# Patient Record
Sex: Female | Born: 1990 | Race: White | Hispanic: No | Marital: Married | Smoking: Never smoker
Health system: Southern US, Community
[De-identification: ages and names within clinical notes are randomized; demographics above are authoritative.]

## PROBLEM LIST (undated history)

## (undated) ENCOUNTER — Inpatient Hospital Stay (HOSPITAL_COMMUNITY): Payer: Self-pay

## (undated) ENCOUNTER — Ambulatory Visit

## (undated) DIAGNOSIS — R51 Headache: Secondary | ICD-10-CM

## (undated) DIAGNOSIS — B950 Streptococcus, group A, as the cause of diseases classified elsewhere: Secondary | ICD-10-CM

## (undated) DIAGNOSIS — B951 Streptococcus, group B, as the cause of diseases classified elsewhere: Secondary | ICD-10-CM

## (undated) DIAGNOSIS — F329 Major depressive disorder, single episode, unspecified: Secondary | ICD-10-CM

## (undated) DIAGNOSIS — O0930 Supervision of pregnancy with insufficient antenatal care, unspecified trimester: Secondary | ICD-10-CM

## (undated) DIAGNOSIS — F410 Panic disorder [episodic paroxysmal anxiety] without agoraphobia: Secondary | ICD-10-CM

## (undated) DIAGNOSIS — E271 Primary adrenocortical insufficiency: Secondary | ICD-10-CM

## (undated) DIAGNOSIS — O98819 Other maternal infectious and parasitic diseases complicating pregnancy, unspecified trimester: Secondary | ICD-10-CM

## (undated) DIAGNOSIS — G932 Benign intracranial hypertension: Secondary | ICD-10-CM

## (undated) DIAGNOSIS — M726 Necrotizing fasciitis: Secondary | ICD-10-CM

## (undated) HISTORY — DX: Headache: R51

## (undated) HISTORY — DX: Major depressive disorder, single episode, unspecified: F32.9

## (undated) HISTORY — DX: Streptococcus, group A, as the cause of diseases classified elsewhere: B95.0

## (undated) HISTORY — DX: Supervision of pregnancy with insufficient antenatal care, unspecified trimester: O09.30

## (undated) HISTORY — PX: TONSILLECTOMY: SUR1361

## (undated) HISTORY — DX: Other maternal infectious and parasitic diseases complicating pregnancy, unspecified trimester: O98.819

## (undated) HISTORY — PX: OTHER SURGICAL HISTORY: SHX169

## (undated) HISTORY — DX: Benign intracranial hypertension: G93.2

## (undated) HISTORY — PX: LUMBAR PUNCTURE: SHX1985

## (undated) HISTORY — DX: Necrotizing fasciitis: M72.6

## (undated) HISTORY — PX: PYLOROMYOTOMY: SUR1063

## (undated) HISTORY — DX: Streptococcus, group b, as the cause of diseases classified elsewhere: B95.1

---
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
## 2010-06-25 ENCOUNTER — Emergency Department: Payer: Self-pay | Admitting: Emergency Medicine

## 2012-07-18 ENCOUNTER — Inpatient Hospital Stay: Payer: Self-pay | Admitting: Obstetrics and Gynecology

## 2012-07-19 LAB — CBC WITH DIFFERENTIAL/PLATELET
Basophil %: 0.5 %
Eosinophil #: 0.1 10*3/uL (ref 0.0–0.7)
Eosinophil %: 0.7 %
HGB: 13 g/dL (ref 12.0–16.0)
Lymphocyte #: 2.5 10*3/uL (ref 1.0–3.6)
MCHC: 34.2 g/dL (ref 32.0–36.0)
Monocyte #: 1.2 x10 3/mm — ABNORMAL HIGH (ref 0.2–0.9)
Neutrophil #: 8 10*3/uL — ABNORMAL HIGH (ref 1.4–6.5)
Neutrophil %: 67.4 %
Platelet: 209 10*3/uL (ref 150–440)
RDW: 13.3 % (ref 11.5–14.5)

## 2012-07-21 LAB — HEMATOCRIT: HCT: 30.4 % — ABNORMAL LOW (ref 35.0–47.0)

## 2012-09-26 NOTE — L&D Delivery Note (Signed)
Delivery Note At 5:28 AM a viable female was delivered via Vaginal, Spontaneous Delivery (Presentation: Right Occiput Anterior).  APGAR: 9, 9; weight TBD.   Placenta status: Intact, Spontaneous.  Cord: 3 vessels with the following complications: None.    Anesthesia: Epidural  Episiotomy: None Lacerations: None Suture Repair: na Est. Blood Loss (mL): 200  Mom to postpartum.  Baby to nursery-stable.  Pt pushed with good maternal effort to deliver a liveborn female via SVD. Moderate mec but with spontaneous cry.  Cord cut and clamped. Placenta delivered spontaneously intact with 3 vessel cord. No tears or complications. Moma nd baby doing well.   Mattingly Fountaine L 06/22/2013, 6:03 AM

## 2012-11-08 ENCOUNTER — Ambulatory Visit: Payer: Self-pay

## 2012-12-15 ENCOUNTER — Emergency Department: Payer: Self-pay | Admitting: Emergency Medicine

## 2012-12-15 LAB — CBC
HGB: 14.1 g/dL (ref 12.0–16.0)
MCHC: 34.3 g/dL (ref 32.0–36.0)
MCV: 84 fL (ref 80–100)
Platelet: 180 10*3/uL (ref 150–440)
RBC: 4.87 10*6/uL (ref 3.80–5.20)
RDW: 14.9 % — ABNORMAL HIGH (ref 11.5–14.5)
WBC: 9.7 10*3/uL (ref 3.6–11.0)

## 2012-12-15 LAB — URINALYSIS, COMPLETE
Bacteria: NONE SEEN
Bilirubin,UR: NEGATIVE
Blood: NEGATIVE
Leukocyte Esterase: NEGATIVE
Ph: 5 (ref 4.5–8.0)
Protein: NEGATIVE
RBC,UR: 1 /HPF (ref 0–5)
Specific Gravity: 1.025 (ref 1.003–1.030)

## 2012-12-15 LAB — HCG, QUANTITATIVE, PREGNANCY: Beta Hcg, Quant.: 46608 m[IU]/mL — ABNORMAL HIGH

## 2012-12-15 LAB — COMPREHENSIVE METABOLIC PANEL
Albumin: 3.4 g/dL (ref 3.4–5.0)
Alkaline Phosphatase: 58 U/L (ref 50–136)
BUN: 11 mg/dL (ref 7–18)
Bilirubin,Total: 0.4 mg/dL (ref 0.2–1.0)
Calcium, Total: 8.9 mg/dL (ref 8.5–10.1)
Chloride: 102 mmol/L (ref 98–107)
Co2: 24 mmol/L (ref 21–32)
Creatinine: 0.49 mg/dL — ABNORMAL LOW (ref 0.60–1.30)
EGFR (African American): >60
Osmolality: 271 (ref 275–301)
SGOT(AST): 25 U/L (ref 15–37)
SGPT (ALT): 25 U/L (ref 12–78)
Sodium: 136 mmol/L (ref 136–145)

## 2012-12-15 LAB — PRO B NATRIURETIC PEPTIDE: B-Type Natriuretic Peptide: 30 pg/mL (ref 0–125)

## 2013-02-13 ENCOUNTER — Ambulatory Visit (INDEPENDENT_AMBULATORY_CARE_PROVIDER_SITE_OTHER): Payer: Self-pay

## 2013-02-13 DIAGNOSIS — Z3491 Encounter for supervision of normal pregnancy, unspecified, first trimester: Secondary | ICD-10-CM

## 2013-02-14 LAB — OBSTETRIC PANEL
Eosinophils Absolute: 0.1 10*3/uL (ref 0.0–0.7)
HCT: 34.9 % — ABNORMAL LOW (ref 36.0–46.0)
Hemoglobin: 12 g/dL (ref 12.0–15.0)
Hepatitis B Surface Ag: NEGATIVE
Lymphs Abs: 2.6 10*3/uL (ref 0.7–4.0)
MCH: 29.4 pg (ref 26.0–34.0)
Monocytes Relative: 10 % (ref 3–12)
Neutrophils Relative %: 62 % (ref 43–77)
RBC: 4.08 MIL/uL (ref 3.87–5.11)
Rh Type: POSITIVE

## 2013-02-14 LAB — POCT PREGNANCY, URINE: Preg Test, Ur: POSITIVE — AB

## 2013-03-06 ENCOUNTER — Ambulatory Visit (INDEPENDENT_AMBULATORY_CARE_PROVIDER_SITE_OTHER): Payer: Managed Care, Other (non HMO) | Admitting: Advanced Practice Midwife

## 2013-03-06 ENCOUNTER — Encounter: Payer: Self-pay | Admitting: Advanced Practice Midwife

## 2013-03-06 VITALS — BP 120/80 | Temp 98.5°F | Ht 59.0 in | Wt 237.6 lb

## 2013-03-06 DIAGNOSIS — O0933 Supervision of pregnancy with insufficient antenatal care, third trimester: Secondary | ICD-10-CM

## 2013-03-06 DIAGNOSIS — O26893 Other specified pregnancy related conditions, third trimester: Secondary | ICD-10-CM

## 2013-03-06 DIAGNOSIS — N898 Other specified noninflammatory disorders of vagina: Secondary | ICD-10-CM

## 2013-03-06 DIAGNOSIS — Z3482 Encounter for supervision of other normal pregnancy, second trimester: Secondary | ICD-10-CM

## 2013-03-06 DIAGNOSIS — Z3493 Encounter for supervision of normal pregnancy, unspecified, third trimester: Secondary | ICD-10-CM | POA: Insufficient documentation

## 2013-03-06 DIAGNOSIS — O0930 Supervision of pregnancy with insufficient antenatal care, unspecified trimester: Secondary | ICD-10-CM

## 2013-03-06 DIAGNOSIS — O9989 Other specified diseases and conditions complicating pregnancy, childbirth and the puerperium: Secondary | ICD-10-CM

## 2013-03-06 HISTORY — DX: Supervision of pregnancy with insufficient antenatal care, unspecified trimester: O09.30

## 2013-03-06 LAB — POCT URINALYSIS DIP (DEVICE)
Glucose, UA: NEGATIVE mg/dL
Ketones, ur: 15 mg/dL — AB
Specific Gravity, Urine: >=1.03 (ref 1.005–1.030)

## 2013-03-06 NOTE — Progress Notes (Signed)
Pulse- 92 Patient reports frequent cramping but relates this pain to constipation; also reports creamy d/c with odor

## 2013-03-06 NOTE — Progress Notes (Signed)
   Subjective:    Carol [name] is a G2P1001 [redacted]w[redacted]d being seen today for her first obstetrical visit.  Her obstetrical history is significant for elevated blood pressure at 39 weeks of her previous pregnancy leading to induction. Pt reports baby was stuck in birth control leading to difficult birth and complications for baby, including blood clots, seizures, and stroke. . Patient does intend to breast feed. Pregnancy history fully reviewed.    Patient reports intermittent abdominal cramping, backache, and nausea. She describes these sx as comparable to her previous pregnancy. Complains of creamy, odorous vaginal discharge.  Filed Vitals:   03/06/13 1312 03/06/13 1315  BP: 120/80   Temp: 98.5 F (36.9 C)   Height:  4\' 11"  (1.499 m)  Weight: 107.775 kg (237 lb 9.6 oz)     HISTORY: OB History   Grav Para Term Preterm Abortions TAB SAB Ect Mult Living   2 1 1  0 0 0 0 0 0 1     # Outc Date GA Lbr Len/2nd Wgt Sex Del Anes PTL Lv   1 TRM 10/13 [redacted]w[redacted]d  1.610RU([id number]) M SVD EPI  Yes   Comments: patient had high BP x1 prenatal visit & was induced; infant was stuck in birth canal 1.5 hrs    2 CUR              Past Medical History  Diagnosis Date  . Pseudotumor cerebri   . 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 Lumbar puncture    . Pyloromyotomy    . Tonsillectomy     Family History  Problem Relation Age of Onset  . Asthma Father   . Asthma Sister   . Mental illness Sister   . Diabetes Maternal Grandmother   . Cancer Paternal Grandmother   . Diabetes Paternal Grandfather      Exam    Uterus:   Fundal height 29 cm.  Pelvic Exam:    Perineum: No Hemorrhoids, Normal Perineum   Vulva: normal   Vagina:  normal mucosa, normal discharge, wet prep done       Cervix: anteverted, no bleeding following Pap, no cervical motion tenderness and no lesions   Adnexa: normal adnexa  and no mass, fullness, tenderness      System:     Skin: normal coloration and turgor, no rashes                                Abdomen: soft, non-tender; bowel sounds normal; no masses,  no organomegaly   Fetal heart rate 150 bpm   Assessment:    Pregnancy: G2P1001 Late prenatal care, third trimester History of difficult childbirth   There are no active problems to display for this patient.       Plan:     Initial labs drawn. Continue prenatal vitamins. Obtain medical records from previous childbirth. Problem list reviewed and updated. Genetic Screening discussed; too late.   Ultrasound discussed; fetal survey: ordered.  Follow up in 3 weeks.   Ardis Hughs 03/06/2013

## 2013-03-06 NOTE — Progress Notes (Signed)
I have seen the patient with the resident/student and agree with the above.  Hogan, Heather Donovan  

## 2013-03-06 NOTE — Patient Instructions (Signed)
Prenatal Care  WHAT IS PRENATAL CARE?  Prenatal care means health care during your pregnancy, before your baby is born. Take care of yourself and your baby by:   Getting early prenatal care. If you know you are pregnant, or think you might be pregnant, call your caregiver as soon as possible. Schedule a visit for a general/prenatal examination.  Getting regular prenatal care. Follow your caregiver's schedule for blood and other necessary tests. Do not miss appointments.  Do everything you can to keep yourself and your baby healthy during your pregnancy.  Prenatal care should include evaluation of medical, dietary, educational, psychological, and social needs for the couple and the medical, surgical, and genetic history of the family of the mother and father.  Discuss with your caregiver:  Your medicines, prescription, over-the-counter, and herbal medicines.  Substance abuse, alcohol, smoking, and illegal drugs.  Domestic abuse and violence, if present.  Your immunizations.  Nutrition and diet.  Exercising.  Environment and occupational hazards, at home and at work.  History of sexually transmitted disease, both you and your partner.  Previous pregnancies. WHY IS PRENATAL CARE SO IMPORTANT?  By seeing you regularly, your caregiver has the chance to find problems early, so that they can be treated as soon as possible. Other problems might be prevented. Many studies have shown that early and regular prenatal care is important for the health of both mothers and their babies.  I AM THINKING ABOUT GETTING PREGNANT. HOW CAN I TAKE CARE OF MYSELF?  Taking care of yourself before you get pregnant helps you to have a healthy pregnancy. It also lowers your chances of having a baby born with a birth defect. Here are ways to take care of yourself before you get pregnant:   Eat healthy foods, exercise regularly (30 minutes per day for most days of the week is best), and get enough rest and  sleep. Talk to your caregiver about what kinds of foods and exercises are best for you.  Take 400 micrograms (mcg) of folic acid (one of the B vitamins) every day. The best way to do this is to take a daily multivitamin pill that contains this amount of folic acid. Getting enough of the synthetic (manufactured) form of folic acid every day before you get pregnant and during early pregnancy can help prevent certain birth defects. Many breakfast cereals and other grain products have folic acid added to them, but only certain cereals contain 400 mcg of folic acid per serving. Check the label on your multivitamin or cereal to find the amount of folic acid in the food.  See your caregiver for a complete check up before getting pregnant. Make sure that you have had all your immunization shots, especially for rubella (German measles). Rubella can cause serious birth defects. Chickenpox is another illness you want to avoid during pregnancy. If you have had chickenpox and rubella in the past, you should be immune to them.  Tell your caregiver about any prescription or non-prescription medicines (including herbal remedies) you are taking. Some medicines are not safe to take during pregnancy.  Stop smoking cigarettes, drinking alcohol, or taking illegal drugs. Ask your caregiver for help, if you need it. You can also get help with alcohol and drugs by talking with a member of your faith community, a counselor, or a trusted friend.  Discuss and treat any medical, social, or psychological problems before getting pregnant.  Discuss any history of genetic problems in the mother, father, and their families. Do   genetic testing before getting pregnant, when possible.  Discuss any physical or emotional abuse with your caregiver.  Discuss with your caregiver if you might be exposed to harmful chemicals on your job or where you live.  Discuss with your caregiver if you think your job or the hours you work may be  harmful and should be changed.  The father should be involved with the decision making and with all aspects of the pregnancy, labor, and delivery.  If you have medical insurance, make sure you are covered for pregnancy. I JUST FOUND OUT THAT I AM PREGNANT. HOW CAN I TAKE CARE OF MYSELF?  Here are ways to take care of yourself and the precious new life growing inside you:   Continue taking your multivitamin with 400 micrograms (mcg) of folic acid every day.  Get early and regular prenatal care. It does not matter if this is your first pregnancy or if you already have children. It is very important to see a caregiver during your pregnancy. Your caregiver will check at each visit to make sure that you and the baby are healthy. If there are any problems, action can be taken right away to help you and the baby.  Eat a healthy diet that includes:  Fruits.  Vegetables.  Foods low in saturated fat.  Grains.  Calcium-rich foods.  Drink 6 to 8 glasses of liquids a day.  Unless your caregiver tells you not to, try to be physically active for 30 minutes, most days of the week. If you are pressed for time, you can get your activity in through 10 minute segments, three times a day.  If you smoke, drink alcohol, or use drugs, STOP. These can cause long-term damage to your baby. Talk with your caregiver about steps to take to stop smoking. Talk with a member of your faith community, a counselor, a trusted friend, or your caregiver if you are concerned about your alcohol or drug use.  Ask your caregiver before taking any medicine, even over-the-counter medicines. Some medicines are not safe to take during pregnancy.  Get plenty of rest and sleep.  Avoid hot tubs and saunas during pregnancy.  Do not have X-rays taken, unless absolutely necessary and with the recommendation of your caregiver. A lead shield can be placed on your abdomen, to protect the baby when X-rays are taken in other parts of the  body.  Do not empty the cat litter when you are pregnant. It may contain a parasite that causes an infection called toxoplasmosis, which can cause birth defects. Also, use gloves when working in garden areas used by cats.  Do not eat uncooked or undercooked cheese, meats, or fish.  Stay away from toxic chemicals like:  Insecticides.  Solvents (some cleaners or paint thinners).  Lead.  Mercury.  Sexual relations may continue until the end of the pregnancy, unless you have a medical problem or there is a problem with the pregnancy and your caregiver tells you not to.  Do not wear high heel shoes, especially during the second half of the pregnancy. You can lose your balance and fall.  Do not take long trips, unless absolutely necessary. Be sure to see your caregiver before going on the trip.  Do not sit in one position for more than 2 hours, when on a trip.  Take a copy of your medical records when going on a trip.  Know where there is a hospital in the city you are visiting, in case of an   emergency.  Most dangerous household products will have pregnancy warnings on their labels. Ask your caregiver about products if you are unsure.  Limit or eliminate your caffeine intake from coffee, tea, sodas, medicines, and chocolate.  Many women continue working through pregnancy. Staying active might help you stay healthier. If you have a question about the safety or the hours you work at your particular job, talk with your caregiver.  Get informed:  Read books.  Watch videos.  Go to childbirth classes for you and the father.  Talk with experienced moms.  Ask your caregiver about childbirth education classes for you and your partner. Classes can help you and your partner prepare for the birth of your baby.  Ask about a pediatrician (baby doctor) and methods and pain medicine for labor, delivery, and possible Cesarean delivery (C-section). I AM NOT THINKING ABOUT GETTING PREGNANT  RIGHT NOW, BUT HEARD THAT ALL WOMEN SHOULD TAKE FOLIC ACID EVERY DAY?  All women of childbearing age, with even a remote chance of getting pregnant, should try to make sure they get enough folic acid. Many pregnancies are not planned. Many women do not know they are actually pregnant early in their pregnancies, and certain birth defects happen in the very early part of pregnancy. Taking 400 micrograms (mcg) of folic acid every day will help prevent certain birth defects that happen in the early part of pregnancy. If a woman begins taking vitamin pills in the second or third month of pregnancy, it may be too late to prevent birth defects. Folic acid may also have other health benefits for women, besides preventing birth defects.  HOW OFTEN SHOULD I SEE MY CAREGIVER DURING PREGNANCY?  Your caregiver will give you a schedule for your prenatal visits. You will have visits more often as you get closer to the end of your pregnancy. An average pregnancy lasts about 40 weeks.  A typical schedule includes visiting your caregiver:   About once each month, during your first 6 months of pregnancy.  Every 2 weeks, during the next 2 months.  Weekly in the last month, until the delivery date. Your caregiver will probably want to see you more often if:  You are over 35.  Your pregnancy is high risk, because you have certain health problems or problems with the pregnancy, such as:  Diabetes.  High blood pressure.  The baby is not growing on schedule, according to the dates of the pregnancy. Your caregiver will do special tests, to make sure you and the baby are not having any serious problems. WHAT HAPPENS DURING PRENATAL VISITS?   At your first prenatal visit, your caregiver will talk to you about you and your partner's health history and your family's health history, and will do a physical exam.  On your first visit, a physical exam will include checks of your blood pressure, height and weight, and an  exam of your pelvic organs. Your caregiver will do a Pap test if you have not had one recently, and will do cultures of your cervix to make sure there is no infection.  At each visit, there will be tests of your blood, urine, blood pressure, weight, and checking the progress of the baby.  Your caregiver will be able to tell you when to expect that your baby will be born.  Each visit is also a chance for you to learn about staying healthy during pregnancy and for asking questions.  Discuss whether you will be breastfeeding.  At your later prenatal   visits, your caregiver will check how you are doing and how the baby is developing. You may have a number of tests done as your pregnancy progresses.  Ultrasound exams are often used to check on the baby's growth and health.  You may have more urine and blood tests, as well as special tests, if needed. These may include amniocentesis (examine fluid in the pregnancy sac), stress tests (check how baby responds to contractions), biophysical profile (measures fetus well-being). Your caregiver will explain the tests and why they are necessary. I AM IN MY LATE THIRTIES, AND I WANT TO HAVE A CHILD NOW. SHOULD I DO ANYTHING SPECIAL?  As you get older, there is more chance of having a medical problem (high blood pressure), pregnancy problem (preeclampsia, problems with the placenta), miscarriage, or a baby born with a birth defect. However, most women in their late thirties and early forties have healthy babies. See your caregiver on a regular basis before you get pregnant and be sure to go for exams throughout your pregnancy. Your caregiver probably will want to do some special tests to check on you and your baby's health when you are pregnant.  Women today are often delaying having children until later in life, when they are in their thirties and forties. While many women in their thirties and forties have no difficulty getting pregnant, fertility does decline  with age. For women over 40 who cannot get pregnant after 6 months of trying, it is recommended that they see their caregiver for a fertility evaluation. It is not uncommon to have trouble becoming pregnant or experience infertility (inability to become pregnant after trying for one year). If you think that you or your partner may be infertile, you can discuss this with your caregiver. He or she can recommend treatments such as drugs, surgery, or assisted reproductive technology.  Document Released: 09/15/2003 Document Revised: 12/05/2011 Document Reviewed: 08/12/2009 [employer] Patient Information 2014 [employer].  

## 2013-03-07 LAB — WET PREP, GENITAL: Clue Cells Wet Prep HPF POC: NONE SEEN

## 2013-03-10 LAB — CULTURE, OB URINE

## 2013-03-11 ENCOUNTER — Telehealth: Payer: Self-pay | Admitting: General Practice

## 2013-03-11 NOTE — Telephone Encounter (Signed)
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

## 2013-03-11 NOTE — Telephone Encounter (Signed)
Message copied by Kathee Delton on Mon Mar 11, 2013 11:52 AM ------      Message from: Thressa Sheller D      Created: Sat Mar 09, 2013 12:57 AM      Regarding: Needs treatment for UTI       Please call and RX Keflex 500mg  TID #21 for UTI            Thank you ------

## 2013-03-12 ENCOUNTER — Ambulatory Visit (HOSPITAL_COMMUNITY)
Admission: RE | Admit: 2013-03-12 | Discharge: 2013-03-12 | Disposition: A | Discharge status: Home / Self Care | Payer: Medicare HMO | Source: Ambulatory Visit | Attending: Advanced Practice Midwife | Admitting: Advanced Practice Midwife

## 2013-03-12 DIAGNOSIS — Z3493 Encounter for supervision of normal pregnancy, unspecified, third trimester: Secondary | ICD-10-CM

## 2013-03-12 DIAGNOSIS — Z3689 Encounter for other specified antenatal screening: Secondary | ICD-10-CM | POA: Insufficient documentation

## 2013-03-12 DIAGNOSIS — Z3482 Encounter for supervision of other normal pregnancy, second trimester: Secondary | ICD-10-CM

## 2013-03-12 DIAGNOSIS — O0933 Supervision of pregnancy with insufficient antenatal care, third trimester: Secondary | ICD-10-CM

## 2013-03-12 DIAGNOSIS — N898 Other specified noninflammatory disorders of vagina: Secondary | ICD-10-CM

## 2013-03-12 DIAGNOSIS — O09299 Supervision of pregnancy with other poor reproductive or obstetric history, unspecified trimester: Secondary | ICD-10-CM | POA: Insufficient documentation

## 2013-03-13 ENCOUNTER — Other Ambulatory Visit: Payer: Self-pay | Admitting: Obstetrics and Gynecology

## 2013-03-13 DIAGNOSIS — N390 Urinary tract infection, site not specified: Secondary | ICD-10-CM

## 2013-03-13 MED ORDER — NITROFURANTOIN MONOHYD MACRO 100 MG PO CAPS: 100.0000 mg | ORAL_CAPSULE | Freq: Two times a day (BID) | ORAL | Status: DC

## 2013-03-13 NOTE — Telephone Encounter (Signed)
Per Wynelle Bourgeois, CNM patient prescribed Macrobid 100mg  one tablet BID X7 days. Left message on patient's vm of new Rx sent to [facility] pharmacy on file. If she has any further questions to give [place] a call back at the clinic.

## 2013-03-27 ENCOUNTER — Ambulatory Visit (INDEPENDENT_AMBULATORY_CARE_PROVIDER_SITE_OTHER): Payer: Medicare HMO | Admitting: Advanced Practice Midwife

## 2013-03-27 VITALS — BP 120/75 | Wt 239.0 lb

## 2013-03-27 DIAGNOSIS — O0933 Supervision of pregnancy with insufficient antenatal care, third trimester: Secondary | ICD-10-CM

## 2013-03-27 DIAGNOSIS — B952 Enterococcus as the cause of diseases classified elsewhere: Secondary | ICD-10-CM

## 2013-03-27 DIAGNOSIS — N390 Urinary tract infection, site not specified: Secondary | ICD-10-CM

## 2013-03-27 DIAGNOSIS — O0930 Supervision of pregnancy with insufficient antenatal care, unspecified trimester: Secondary | ICD-10-CM

## 2013-03-27 DIAGNOSIS — O2390 Unspecified genitourinary tract infection in pregnancy, unspecified trimester: Secondary | ICD-10-CM

## 2013-03-27 LAB — POCT URINALYSIS DIP (DEVICE)
Glucose, UA: NEGATIVE mg/dL
Ketones, ur: NEGATIVE mg/dL
Protein, ur: NEGATIVE mg/dL
Specific Gravity, Urine: >=1.03 (ref 1.005–1.030)
Urobilinogen, UA: 0.2 mg/dL (ref 0.0–1.0)

## 2013-03-27 MED ORDER — NITROFURANTOIN MONOHYD MACRO 100 MG PO CAPS: 100.0000 mg | ORAL_CAPSULE | Freq: Two times a day (BID) | ORAL | Status: DC

## 2013-03-27 NOTE — Progress Notes (Signed)
Have not gotten records from previous pregnancy. Patient will try to pick them up. Never picked up RX for UTI. Will resend today.

## 2013-03-27 NOTE — Progress Notes (Signed)
P-90 

## 2013-04-10 ENCOUNTER — Ambulatory Visit (INDEPENDENT_AMBULATORY_CARE_PROVIDER_SITE_OTHER): Payer: Medicare HMO | Admitting: Advanced Practice Midwife

## 2013-04-10 VITALS — BP 128/86 | Temp 97.0°F | Wt 240.7 lb

## 2013-04-10 DIAGNOSIS — O0933 Supervision of pregnancy with insufficient antenatal care, third trimester: Secondary | ICD-10-CM

## 2013-04-10 DIAGNOSIS — O0930 Supervision of pregnancy with insufficient antenatal care, unspecified trimester: Secondary | ICD-10-CM

## 2013-04-10 LAB — POCT URINALYSIS DIP (DEVICE)
Bilirubin Urine: NEGATIVE
Glucose, UA: NEGATIVE mg/dL
Nitrite: NEGATIVE
Specific Gravity, Urine: >=1.03 (ref 1.005–1.030)
Urobilinogen, UA: 0.2 mg/dL (ref 0.0–1.0)

## 2013-04-10 MED ORDER — NITROFURANTOIN MONOHYD MACRO 100 MG PO CAPS: 100.0000 mg | ORAL_CAPSULE | Freq: Two times a day (BID) | ORAL | Status: DC

## 2013-04-10 NOTE — Progress Notes (Signed)
I have seen the patient with the resident/student and agree with the above.   Addendum to the note: patient's mother is with her today, and is very insistent that she have a planned c-section for this pregnancy. She states that she had to push for a hour and a half, and then the baby was noted to have had strokes while in NICU. They are certain that all of this is from the birth, and want to have a planned c-section. Discussed incidence of fetal and neonatal stroke with the patient and her mother. At the very end of the visit the mother also informs me that the patient has had false tumor cerebrii, and has been followed by neuro. Advised patient to schedule a neuro appointment at this time, and will FU with [facility].

## 2013-04-10 NOTE — Progress Notes (Signed)
No PIH symptoms.  Good fetal movements. Has been having palpitations for 30 seconds and shortness of breath while sitting up.  Never picked up UTI Rx, needs for it to be resent

## 2013-04-10 NOTE — Progress Notes (Signed)
Pulse- 79 Patient reports low back pain & a lot of stomach pain upon sitting

## 2013-04-18 ENCOUNTER — Encounter: Payer: Self-pay | Admitting: Obstetrics & Gynecology

## 2013-04-18 ENCOUNTER — Ambulatory Visit (INDEPENDENT_AMBULATORY_CARE_PROVIDER_SITE_OTHER): Payer: Medicare HMO | Admitting: Obstetrics & Gynecology

## 2013-04-18 VITALS — BP 114/80 | Temp 97.8°F | Wt 241.0 lb

## 2013-04-18 DIAGNOSIS — N898 Other specified noninflammatory disorders of vagina: Secondary | ICD-10-CM

## 2013-04-18 DIAGNOSIS — Z3493 Encounter for supervision of normal pregnancy, unspecified, third trimester: Secondary | ICD-10-CM

## 2013-04-18 DIAGNOSIS — O0930 Supervision of pregnancy with insufficient antenatal care, unspecified trimester: Secondary | ICD-10-CM

## 2013-04-18 NOTE — Progress Notes (Signed)
Pulse 92 Edema trace in ankles. C/o pressure in pelvic. Vaginal d/c stated as thin whitish, with slight odor.

## 2013-04-18 NOTE — Patient Instructions (Signed)

## 2013-04-18 NOTE — Progress Notes (Signed)
No PIH symptoms, good fetal movement.  Pharmacy did not have Rx, needs it to be resent.  Denies fevers, chills.  Continues with palpitations for 30 seconds. Brought in letter today for provider.  Has been having right calf "knotting" and tenderness in the mornings.  Not sure if she has SOB.    Mild vaginal discharge, no vaginal bleeding.

## 2013-04-18 NOTE — Progress Notes (Signed)
Discussed history at last birth, infant had seizures, is on anticonvulsants. Need delivery record, may need growth [place]

## 2013-04-19 LAB — WET PREP, GENITAL
Clue Cells Wet Prep HPF POC: NONE SEEN
Trich, Wet Prep: NONE SEEN
Yeast Wet Prep HPF POC: NONE SEEN

## 2013-04-24 ENCOUNTER — Encounter: Payer: Medicare HMO | Admitting: Obstetrics and Gynecology

## 2013-05-02 ENCOUNTER — Encounter: Payer: Medicare HMO | Admitting: Family Medicine

## 2013-05-09 ENCOUNTER — Encounter: Payer: Self-pay | Admitting: Obstetrics and Gynecology

## 2013-05-09 ENCOUNTER — Ambulatory Visit (INDEPENDENT_AMBULATORY_CARE_PROVIDER_SITE_OTHER): Payer: Managed Care, Other (non HMO) | Admitting: Obstetrics and Gynecology

## 2013-05-09 VITALS — BP 121/82 | Wt 243.7 lb

## 2013-05-09 DIAGNOSIS — O0930 Supervision of pregnancy with insufficient antenatal care, unspecified trimester: Secondary | ICD-10-CM

## 2013-05-09 DIAGNOSIS — O0933 Supervision of pregnancy with insufficient antenatal care, third trimester: Secondary | ICD-10-CM

## 2013-05-09 DIAGNOSIS — Z3493 Encounter for supervision of normal pregnancy, unspecified, third trimester: Secondary | ICD-10-CM

## 2013-05-09 NOTE — Progress Notes (Signed)
Patient doing well without complaints. FM/PTL precautions reviewed. Will order follow up growth ultrasound as patient currently dated by 25 week sono. Patient with ? History of shoulder dystocia- will obtain delivery records

## 2013-05-09 NOTE — Progress Notes (Signed)
U/S scheduled 05/10/13 at 915 am. ROI signed for records from [facility].

## 2013-05-09 NOTE — Progress Notes (Signed)
Pulse- 94 

## 2013-05-10 ENCOUNTER — Ambulatory Visit (HOSPITAL_COMMUNITY)
Admission: RE | Admit: 2013-05-10 | Discharge: 2013-05-10 | Disposition: A | Discharge status: Home / Self Care | Payer: Managed Care, Other (non HMO) | Source: Ambulatory Visit | Attending: Obstetrics and Gynecology | Admitting: Obstetrics and Gynecology

## 2013-05-10 DIAGNOSIS — Z3493 Encounter for supervision of normal pregnancy, unspecified, third trimester: Secondary | ICD-10-CM

## 2013-05-10 DIAGNOSIS — Z3689 Encounter for other specified antenatal screening: Secondary | ICD-10-CM | POA: Insufficient documentation

## 2013-05-10 DIAGNOSIS — O365990 Maternal care for other known or suspected poor fetal growth, unspecified trimester, not applicable or unspecified: Secondary | ICD-10-CM | POA: Insufficient documentation

## 2013-05-15 ENCOUNTER — Inpatient Hospital Stay (HOSPITAL_COMMUNITY): Payer: Managed Care, Other (non HMO)

## 2013-05-15 ENCOUNTER — Encounter (HOSPITAL_COMMUNITY): Payer: Self-pay

## 2013-05-15 ENCOUNTER — Inpatient Hospital Stay (HOSPITAL_COMMUNITY)
Admission: AD | Admit: 2013-05-15 | Discharge: 2013-05-15 | Disposition: A | Discharge status: Home / Self Care | Payer: Managed Care, Other (non HMO) | Source: Ambulatory Visit | Attending: Obstetrics and Gynecology | Admitting: Obstetrics and Gynecology

## 2013-05-15 DIAGNOSIS — R109 Unspecified abdominal pain: Secondary | ICD-10-CM | POA: Insufficient documentation

## 2013-05-15 DIAGNOSIS — Y92410 Unspecified street and highway as the place of occurrence of the external cause: Secondary | ICD-10-CM | POA: Insufficient documentation

## 2013-05-15 DIAGNOSIS — O99891 Other specified diseases and conditions complicating pregnancy: Secondary | ICD-10-CM | POA: Insufficient documentation

## 2013-05-15 HISTORY — DX: Panic disorder (episodic paroxysmal anxiety): F41.0
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
## 2013-05-15 NOTE — Treatment Plan (Signed)
Dr Ike Bene notified of patients status.  Will order U/s

## 2013-05-19 NOTE — MAU Provider Note (Signed)
Attestation of Attending Supervision of Advanced Practitioner: Evaluation and management procedures were performed by the PA/NP/CNM/OB Fellow under my supervision/collaboration. Chart reviewed and agree with management and plan.  Noelle Hoogland V 05/19/2013 10:46 AM

## 2013-05-20 ENCOUNTER — Encounter: Payer: Self-pay | Admitting: *Deleted

## 2013-05-23 ENCOUNTER — Ambulatory Visit (INDEPENDENT_AMBULATORY_CARE_PROVIDER_SITE_OTHER): Payer: Managed Care, Other (non HMO) | Admitting: Advanced Practice Midwife

## 2013-05-23 ENCOUNTER — Encounter: Payer: Self-pay | Admitting: Advanced Practice Midwife

## 2013-05-23 VITALS — BP 128/70 | Temp 97.7°F | Wt 246.0 lb

## 2013-05-23 DIAGNOSIS — Z3493 Encounter for supervision of normal pregnancy, unspecified, third trimester: Secondary | ICD-10-CM

## 2013-05-23 DIAGNOSIS — O0933 Supervision of pregnancy with insufficient antenatal care, third trimester: Secondary | ICD-10-CM

## 2013-05-23 DIAGNOSIS — O0930 Supervision of pregnancy with insufficient antenatal care, unspecified trimester: Secondary | ICD-10-CM

## 2013-05-23 LAB — POCT URINALYSIS DIP (DEVICE)
Bilirubin Urine: NEGATIVE
Glucose, UA: NEGATIVE mg/dL
Hgb urine dipstick: NEGATIVE
Nitrite: NEGATIVE
Protein, ur: NEGATIVE mg/dL
Specific Gravity, Urine: >=1.03 (ref 1.005–1.030)
Urobilinogen, UA: 0.2 mg/dL (ref 0.0–1.0)
pH: 6 (ref 5.0–8.0)

## 2013-05-23 NOTE — Progress Notes (Signed)
Pulse- 106 Patient reports a lot of pelvic pressure and cramps in her side as well as braxton hicks contractions; reports a moderate amount of white d/c with odor

## 2013-05-23 NOTE — Patient Instructions (Addendum)
Vaginal Delivery Your caregiver must first be sure you are in labor. Signs of labor include:  You may pass what is called "the mucus plug" before labor begins. This is a small amount of blood stained mucus.  Regular uterine contractions.  The time between contractions get closer together.  The discomfort and pain gradually gets more intense.  Pains are mostly located in the back.  Pains get worse when walking.  The cervix (the opening of the uterus) becomes thinner (begins to efface) and opens up (dilates). Once you are in labor and admitted into the hospital or care center, your caregiver will do the following:  A complete physical examination.  Check your vital signs (blood pressure, pulse, temperature and the fetal heart rate).  Do a vaginal examination (using a sterile glove and lubricant) to determine:  The position (presentation) of the baby (head [vertex] or buttock first).  The level (station) of the baby's head in the birth canal.  The effacement and dilatation of the cervix.  An electronic monitor is usually placed on your abdomen. The monitor follows the length and intensity of the contractions, as well as the baby's heart rate.  Usually, your caregiver will insert an IV in your arm with a bottle of sugar water. This is done as a precaution so that medications can be given to you quickly during labor or delivery. NORMAL LABOR AND DELIVERY IS DIVIDED UP INTO 3 STAGES: First Stage This is when regular contractions begin and the cervix begins to efface and dilate. This stage can last from 3 to 15 hours. The end of the first stage is when the cervix is 100% effaced and 10 centimeters dilated. Pain medications may be given by   Injection (morphine, demerol, etc.)  Regional anesthesia (spinal, caudal or epidural, anesthetics given in different locations of the spine). Paracervical pain medication may be given, which is an injection of and anesthetic on each side of the  cervix. A pregnant woman may request to have "Natural Childbirth" which is not to have any medications or anesthesia during her labor and delivery. Second Stage This is when the baby comes down through the birth canal (vagina) and is born. This can take 1 to 4 hours. As the baby's head comes down through the birth canal, you may feel like you are going to have a bowel movement. You will get the urge to bear down and push until the baby is delivered. As the baby's head is being delivered, the caregiver will decide if an episiotomy (a cut in the perineum and vagina area) is needed to prevent tearing of the tissue in this area. The episiotomy is sewn up after the delivery of the baby and placenta. Sometimes a mask with nitrous oxide is given for the mother to breath during the delivery of the baby to help if there is too much pain. The end of Stage 2 is when the baby is fully delivered. Then when the umbilical cord stops pulsating it is clamped and cut. Third Stage The third stage begins after the baby is completely delivered and ends after the placenta (afterbirth) is delivered. This usually takes 5 to 30 minutes. After the placenta is delivered, a medication is given either by intravenous or injection to help contract the uterus and prevent bleeding. The third stage is not painful and pain medication is usually not necessary. If an episiotomy was done, it is repaired at this time. After the delivery, the mother is watched and monitored closely for   1 to 2 hours to make sure there is no postpartum bleeding (hemorrhage). If there is a lot of bleeding, medication is given to contract the uterus and stop the bleeding. Document Released: 06/21/2008 Document Revised: 06/06/2012 Document Reviewed: 06/21/2008 Exit[employer] Patient Information 2014 [employer].  

## 2013-05-23 NOTE — Progress Notes (Signed)
Doing well. Concerned re: mode of delivery. States [facility] MD recommends C/S delivery due to unsure cause of infant's seizures after last birth. Will consult MFM.  Per Dr Macon Large, this is not an indication for elective C/S.

## 2013-05-24 ENCOUNTER — Encounter (HOSPITAL_COMMUNITY): Payer: Self-pay

## 2013-05-24 ENCOUNTER — Ambulatory Visit (HOSPITAL_COMMUNITY)
Admission: RE | Admit: 2013-05-24 | Discharge: 2013-05-24 | Disposition: A | Discharge status: Home / Self Care | Payer: Managed Care, Other (non HMO) | Source: Ambulatory Visit | Attending: Advanced Practice Midwife | Admitting: Advanced Practice Midwife

## 2013-05-24 VITALS — BP 118/74 | HR 98 | Wt 247.0 lb

## 2013-05-24 DIAGNOSIS — Z3493 Encounter for supervision of normal pregnancy, unspecified, third trimester: Secondary | ICD-10-CM

## 2013-05-24 DIAGNOSIS — O0933 Supervision of pregnancy with insufficient antenatal care, third trimester: Secondary | ICD-10-CM

## 2013-05-24 LAB — GC/CHLAMYDIA PROBE AMP
CT Probe RNA: NEGATIVE
GC Probe RNA: NEGATIVE

## 2013-05-24 NOTE — Consult Note (Signed)
Maternal Fetal Medicine Consultation  Requesting Provider(s): Wynelle Bourgeois, CNM  Reason for consultation: discuss route of delivery   HPI: Carol [name] is a 22 yo G2P1001 currently at 57 3/7 weeks seen for counseling to discuss route of delivery.  She reports that her previous delivery was an induction of labor for elevated blood pressures at 39 weeks.  She pushed for 1.5 hours - was told that the "head was stuck" - tried pushing in multiple positions.  She ultimately delivered a 2555g baby - the patient and father of the baby report that the shoulders delivered easily thereafter.  Reviewed delivery notes - there is no mention of shoulder dystocia - had a 1st degree laceration.  Delivery note comments that a nuchal cord was reduced and that the posterior shoulder and body delivered - but no mention of maneuvers to relieve shoulder dystocia.  The couple reports that there were no injuries to the clavicle or humerus - no brachial plexus injuries from what I can determine from the delivery note.  The patient reports that there was fetal tachycardia before delivery but that a C-section was not performed because the fetal head was so low into the pelvis that a C-section would be difficult and likely traumatic.   Apgars were 5 and 7 - infant was transferred to NICU and later sent to [facility] due to seizures shortly after birth.  The thought is that the seizures were due to an antepartum stroke vs. Traumatic delivery.  Her nurse midwife told her that a cesarean section "should be looked into" - and the child's neurologist at [facility] thought that a primary C-section would be reasonable - but has never had this advice from an obstetrician or MFM.  Ms. Excell Seltzer reports that she had a normal diabetes screen.  Most recent EFW was performed on 8/15 showed an EFW of 2549g.  Her prenatal course has otherwise been uncomplicated.   OB History: OB History   Grav Para Term Preterm Abortions TAB SAB Ect Mult Living   2 1 1  0  0 0 0 0 0 1      PMH:  Past Medical History  Diagnosis Date  . Pseudotumor cerebri   . 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 Panic attacks     PSH:  Past Surgical History  Procedure Laterality Date  . Lumbar puncture    . Pyloromyotomy    . Tonsillectomy     Meds: PNV  Allergies:  Allergies  Allergen Reactions  . Penicillins Anaphylaxis   FH: reports family history of autism  Soc: denies tobacco, ETOH use  Review of Systems: no vaginal bleeding or cramping/contractions, no LOF, no nausea/vomiting. All other systems reviewed and are negative.  PE:   Filed Vitals:   05/24/13 1304  BP: 118/74  Pulse: 98  Ht: 4'11oz  GEN: well-appearing female ABD: gravid, NT    A/P: 1) Single IUP at [redacted]w[redacted]d         2) Previous child with seizures - ? Traumatic delivery vs. Intrauterine stroke.  Based on the patient's history and review of the previous delivery note, it does not appear to have been associated with shoulder dystocia.  In the absence of a brachial plexus injury or a documented shoulder dystocia, I would not offer a primary C-section.  That said, however, would have a low threshold to perform a C-section - particularly given the patients short stature and  her prior history.   Would not perform an operative vaginal delivery (vacuum or forceps).  Would follow her labor course very closely - if she does not progress appropriately and falls off the Friedman curve would move toward Cesarean delivery promptly.     Thank you for the opportunity to be a part of the care of Carol [name]. Please contact our office if we can be of further assistance.   I spent approximately 30 minutes with this patient with over 50% of time spent in face-to-face counseling.  Alpha Gula, MD Maternal Fetal Medicine

## 2013-05-25 ENCOUNTER — Encounter: Payer: Self-pay | Admitting: Advanced Practice Midwife

## 2013-05-27 ENCOUNTER — Encounter: Payer: Self-pay | Admitting: Advanced Practice Midwife

## 2013-05-27 DIAGNOSIS — B951 Streptococcus, group B, as the cause of diseases classified elsewhere: Secondary | ICD-10-CM

## 2013-05-27 DIAGNOSIS — O98819 Other maternal infectious and parasitic diseases complicating pregnancy, unspecified trimester: Secondary | ICD-10-CM

## 2013-05-27 HISTORY — DX: Streptococcus, group b, as the cause of diseases classified elsewhere: B95.1

## 2013-05-28 LAB — CULTURE, BETA STREP (GROUP B ONLY)

## 2013-05-31 ENCOUNTER — Ambulatory Visit (INDEPENDENT_AMBULATORY_CARE_PROVIDER_SITE_OTHER): Payer: Managed Care, Other (non HMO) | Admitting: Advanced Practice Midwife

## 2013-05-31 ENCOUNTER — Encounter: Payer: Self-pay | Admitting: Advanced Practice Midwife

## 2013-05-31 VITALS — BP 128/82 | Wt 243.1 lb

## 2013-05-31 DIAGNOSIS — O98819 Other maternal infectious and parasitic diseases complicating pregnancy, unspecified trimester: Secondary | ICD-10-CM

## 2013-05-31 DIAGNOSIS — B951 Streptococcus, group B, as the cause of diseases classified elsewhere: Secondary | ICD-10-CM

## 2013-05-31 NOTE — Patient Instructions (Signed)
Vaginal Delivery Your caregiver must first be sure you are in labor. Signs of labor include:  You may pass what is called "the mucus plug" before labor begins. This is a small amount of blood stained mucus.  Regular uterine contractions.  The time between contractions get closer together.  The discomfort and pain gradually gets more intense.  Pains are mostly located in the back.  Pains get worse when walking.  The cervix (the opening of the uterus) becomes thinner (begins to efface) and opens up (dilates). Once you are in labor and admitted into the hospital or care center, your caregiver will do the following:  A complete physical examination.  Check your vital signs (blood pressure, pulse, temperature and the fetal heart rate).  Do a vaginal examination (using a sterile glove and lubricant) to determine:  The position (presentation) of the baby (head [vertex] or buttock first).  The level (station) of the baby's head in the birth canal.  The effacement and dilatation of the cervix.  An electronic monitor is usually placed on your abdomen. The monitor follows the length and intensity of the contractions, as well as the baby's heart rate.  Usually, your caregiver will insert an IV in your arm with a bottle of sugar water. This is done as a precaution so that medications can be given to you quickly during labor or delivery. NORMAL LABOR AND DELIVERY IS DIVIDED UP INTO 3 STAGES: First Stage This is when regular contractions begin and the cervix begins to efface and dilate. This stage can last from 3 to 15 hours. The end of the first stage is when the cervix is 100% effaced and 10 centimeters dilated. Pain medications may be given by   Injection (morphine, demerol, etc.)  Regional anesthesia (spinal, caudal or epidural, anesthetics given in different locations of the spine). Paracervical pain medication may be given, which is an injection of and anesthetic on each side of the  cervix. A pregnant woman may request to have "Natural Childbirth" which is not to have any medications or anesthesia during her labor and delivery. Second Stage This is when the baby comes down through the birth canal (vagina) and is born. This can take 1 to 4 hours. As the baby's head comes down through the birth canal, you may feel like you are going to have a bowel movement. You will get the urge to bear down and push until the baby is delivered. As the baby's head is being delivered, the caregiver will decide if an episiotomy (a cut in the perineum and vagina area) is needed to prevent tearing of the tissue in this area. The episiotomy is sewn up after the delivery of the baby and placenta. Sometimes a mask with nitrous oxide is given for the mother to breath during the delivery of the baby to help if there is too much pain. The end of Stage 2 is when the baby is fully delivered. Then when the umbilical cord stops pulsating it is clamped and cut. Third Stage The third stage begins after the baby is completely delivered and ends after the placenta (afterbirth) is delivered. This usually takes 5 to 30 minutes. After the placenta is delivered, a medication is given either by intravenous or injection to help contract the uterus and prevent bleeding. The third stage is not painful and pain medication is usually not necessary. If an episiotomy was done, it is repaired at this time. After the delivery, the mother is watched and monitored closely for   1 to 2 hours to make sure there is no postpartum bleeding (hemorrhage). If there is a lot of bleeding, medication is given to contract the uterus and stop the bleeding. Document Released: 06/21/2008 Document Revised: 06/06/2012 Document Reviewed: 06/21/2008 Exit[employer] Patient Information 2014 [employer].  

## 2013-05-31 NOTE — Progress Notes (Signed)
Cervix softening. Labor discussed. MFM recommends trial of Vaginal delivery with low threshold for C/S

## 2013-05-31 NOTE — Progress Notes (Signed)
Pulse: 106 Has contractions off and on.

## 2013-06-03 ENCOUNTER — Ambulatory Visit (HOSPITAL_COMMUNITY): Admission: RE | Admit: 2013-06-03 | Payer: Managed Care, Other (non HMO) | Source: Ambulatory Visit

## 2013-06-03 ENCOUNTER — Other Ambulatory Visit: Payer: Self-pay | Admitting: Advanced Practice Midwife

## 2013-06-03 ENCOUNTER — Ambulatory Visit (HOSPITAL_COMMUNITY)
Admission: RE | Admit: 2013-06-03 | Discharge: 2013-06-03 | Disposition: A | Discharge status: Home / Self Care | Payer: Managed Care, Other (non HMO) | Source: Ambulatory Visit | Attending: Advanced Practice Midwife | Admitting: Advanced Practice Midwife

## 2013-06-03 DIAGNOSIS — B951 Streptococcus, group B, as the cause of diseases classified elsewhere: Secondary | ICD-10-CM

## 2013-06-03 DIAGNOSIS — Z3689 Encounter for other specified antenatal screening: Secondary | ICD-10-CM | POA: Insufficient documentation

## 2013-06-03 DIAGNOSIS — O3660X0 Maternal care for excessive fetal growth, unspecified trimester, not applicable or unspecified: Secondary | ICD-10-CM | POA: Insufficient documentation

## 2013-06-03 NOTE — Progress Notes (Signed)
MFM ultrasound  Indication: 22 yr old G2P1001 at [redacted]w[redacted]d for fetal growth secondary to size>dates. Previous child with neonatal seizures and difficult vaginal delivery. Remote read.  Findings: 1. Single intrauterine pregnancy. 2. Estimated fetal weight is in the 64th%. The abdominal circumference is accelerated and head:abdominal circumference ratio is 0.93. 3. Posterior placenta without evidence of previa. 4. Normal amniotic fluid index. 5. The limited anatomy survey is normal.  Recommendations: 1. Overall appropriate fetal growth; however the abdominal circumference is accelerated: - this growth pattern increases the risk of shoulder dystocia - in light of this growth pattern and birth history would recommend trial of labor with strict adherence to the labor curve and low threshold for C section; would avoid operative delivery - previously counseled on mode of delivery 2. Recommend follow up ultrasounds as clinically indicated  Eulis Foster, MD

## 2013-06-04 ENCOUNTER — Encounter: Payer: Self-pay | Admitting: Advanced Practice Midwife

## 2013-06-05 ENCOUNTER — Telehealth: Payer: Self-pay

## 2013-06-05 NOTE — Telephone Encounter (Signed)
Pt called and stated that she is 39wks and something weird happened and wanted to know if was normal? Called and it went straight to VM and left message to return call to the clinics.

## 2013-06-07 ENCOUNTER — Ambulatory Visit (INDEPENDENT_AMBULATORY_CARE_PROVIDER_SITE_OTHER): Payer: Managed Care, Other (non HMO) | Admitting: Advanced Practice Midwife

## 2013-06-07 VITALS — BP 120/83 | Temp 97.2°F | Wt 244.4 lb

## 2013-06-07 DIAGNOSIS — O368190 Decreased fetal movements, unspecified trimester, not applicable or unspecified: Secondary | ICD-10-CM | POA: Diagnosis not present

## 2013-06-07 DIAGNOSIS — O0930 Supervision of pregnancy with insufficient antenatal care, unspecified trimester: Secondary | ICD-10-CM

## 2013-06-07 DIAGNOSIS — O0933 Supervision of pregnancy with insufficient antenatal care, third trimester: Secondary | ICD-10-CM

## 2013-06-07 DIAGNOSIS — Z3493 Encounter for supervision of normal pregnancy, unspecified, third trimester: Secondary | ICD-10-CM

## 2013-06-07 LAB — POCT URINALYSIS DIP (DEVICE)
Bilirubin Urine: NEGATIVE
Hgb urine dipstick: NEGATIVE
Ketones, ur: NEGATIVE mg/dL
Protein, ur: NEGATIVE mg/dL
Specific Gravity, Urine: >=1.03 (ref 1.005–1.030)
pH: 6 (ref 5.0–8.0)

## 2013-06-07 NOTE — Patient Instructions (Addendum)
Braxton Hicks Contractions °Pregnancy is commonly associated with contractions of the uterus throughout the pregnancy. Towards the end of pregnancy (32 to 34 weeks), these contractions (Braxton Hicks) can develop more often and may become more forceful. This is not true labor because these contractions do not result in opening (dilatation) and thinning of the cervix. They are sometimes difficult to tell apart from true labor because these contractions can be forceful and people have different pain tolerances. You should not feel embarrassed if you go to the hospital with false labor. Sometimes, the only way to tell if you are in true labor is for your caregiver to follow the changes in the cervix. °How to tell the difference between true and false labor: °· False labor. °· The contractions of false labor are usually shorter, irregular and not as hard as those of true labor. °· They are often felt in the front of the lower abdomen and in the groin. °· They may leave with walking around or changing positions while lying down. °· They get weaker and are shorter lasting as time goes on. °· These contractions are usually irregular. °· They do not usually become progressively stronger, regular and closer together as with true labor. °· True labor. °· Contractions in true labor last 30 to 70 seconds, become very regular, usually become more intense, and increase in frequency. °· They do not go away with walking. °· The discomfort is usually felt in the top of the uterus and spreads to the lower abdomen and low back. °· True labor can be determined by your caregiver with an exam. This will show that the cervix is dilating and getting thinner. °If there are no prenatal problems or other health problems associated with the pregnancy, it is completely safe to be sent home with false labor and await the onset of true labor. °HOME CARE INSTRUCTIONS  °· Keep up with your usual exercises and instructions. °· Take medications as  directed. °· Keep your regular prenatal appointment. °· Eat and drink lightly if you think you are going into labor. °· If BH contractions are making you uncomfortable: °· Change your activity position from lying down or resting to walking/walking to resting. °· Sit and rest in a tub of warm water. °· Drink 2 to 3 glasses of water. Dehydration may cause B-H contractions. °· Do slow and deep breathing several times an hour. °SEEK IMMEDIATE MEDICAL CARE IF:  °· Your contractions continue to become stronger, more regular, and closer together. °· You have a gushing, burst or leaking of fluid from the vagina. °· An oral temperature above 102° F (38.9° C) develops. °· You have passage of blood-tinged mucus. °· You develop vaginal bleeding. °· You develop continuous belly (abdominal) pain. °· You have low back pain that you never had before. °· You feel the baby's head pushing down causing pelvic pressure. °· The baby is not moving as much as it used to. °Document Released: 09/12/2005 Document Revised: 12/05/2011 Document Reviewed: 03/06/2009 °Exit[employer]® Patient Information ©2014 [employer]. ° ° °Fetal Movement Counts °Patient Name: __________________________________________________ Patient Due Date: ____________________ °Performing a fetal movement count is highly recommended in high-risk pregnancies, but it is good for every pregnant woman to do. Your caregiver may ask you to start counting fetal movements at 28 weeks of the pregnancy. Fetal movements often increase: °· After eating a full meal. °· After physical activity. °· After eating or drinking something sweet or cold. °· At rest. °Pay attention to when you   feel the baby is most active. This will help you notice a pattern of your baby's sleep and wake cycles and what factors contribute to an increase in fetal movement. It is important to perform a fetal movement count at the same time each day when your baby is normally most active.  °HOW TO COUNT FETAL  MOVEMENTS °1. Find a quiet and comfortable area to sit or lie down on your left side. Lying on your left side provides the best blood and oxygen circulation to your baby. °2. Write down the day and time on a sheet of paper or in a journal. °3. Start counting kicks, flutters, swishes, rolls, or jabs in a 2 hour period. You should feel at least 10 movements within 2 hours. °4. If you do not feel 10 movements in 2 hours, wait 2 3 hours and count again. Look for a change in the pattern or not enough counts in 2 hours. °SEEK MEDICAL CARE IF: °· You feel less than 10 counts in 2 hours, tried twice. °· There is no movement in over an hour. °· The pattern is changing or taking longer each day to reach 10 counts in 2 hours. °· You feel the baby is not moving as he or she usually does. °Date: ____________ Movements: ____________ Start time: ____________ Finish time: ____________  °Date: ____________ Movements: ____________ Start time: ____________ Finish time: ____________ °Date: ____________ Movements: ____________ Start time: ____________ Finish time: ____________ °Date: ____________ Movements: ____________ Start time: ____________ Finish time: ____________ °Date: ____________ Movements: ____________ Start time: ____________ Finish time: ____________ °Date: ____________ Movements: ____________ Start time: ____________ Finish time: ____________ °Date: ____________ Movements: ____________ Start time: ____________ Finish time: ____________ °Date: ____________ Movements: ____________ Start time: ____________ Finish time: ____________  °Date: ____________ Movements: ____________ Start time: ____________ Finish time: ____________ °Date: ____________ Movements: ____________ Start time: ____________ Finish time: ____________ °Date: ____________ Movements: ____________ Start time: ____________ Finish time: ____________ °Date: ____________ Movements: ____________ Start time: ____________ Finish time: ____________ °Date: ____________  Movements: ____________ Start time: ____________ Finish time: ____________ °Date: ____________ Movements: ____________ Start time: ____________ Finish time: ____________ °Date: ____________ Movements: ____________ Start time: ____________ Finish time: ____________  °Date: ____________ Movements: ____________ Start time: ____________ Finish time: ____________ °Date: ____________ Movements: ____________ Start time: ____________ Finish time: ____________ °Date: ____________ Movements: ____________ Start time: ____________ Finish time: ____________ °Date: ____________ Movements: ____________ Start time: ____________ Finish time: ____________ °Date: ____________ Movements: ____________ Start time: ____________ Finish time: ____________ °Date: ____________ Movements: ____________ Start time: ____________ Finish time: ____________ °Date: ____________ Movements: ____________ Start time: ____________ Finish time: ____________  °Date: ____________ Movements: ____________ Start time: ____________ Finish time: ____________ °Date: ____________ Movements: ____________ Start time: ____________ Finish time: ____________ °Date: ____________ Movements: ____________ Start time: ____________ Finish time: ____________ °Date: ____________ Movements: ____________ Start time: ____________ Finish time: ____________ °Date: ____________ Movements: ____________ Start time: ____________ Finish time: ____________ °Date: ____________ Movements: ____________ Start time: ____________ Finish time: ____________ °Date: ____________ Movements: ____________ Start time: ____________ Finish time: ____________  °Date: ____________ Movements: ____________ Start time: ____________ Finish time: ____________ °Date: ____________ Movements: ____________ Start time: ____________ Finish time: ____________ °Date: ____________ Movements: ____________ Start time: ____________ Finish time: ____________ °Date: ____________ Movements: ____________ Start time:  ____________ Finish time: ____________ °Date: ____________ Movements: ____________ Start time: ____________ Finish time: ____________ °Date: ____________ Movements: ____________ Start time: ____________ Finish time: ____________ °Date: ____________ Movements: ____________ Start time: ____________ Finish time: ____________  °Date: ____________ Movements: ____________ Start time: ____________ Finish time: ____________ °Date: ____________ Movements: ____________   Start time: ____________ Finish time: ____________ °Date: ____________ Movements: ____________ Start time: ____________ Finish time: ____________ °Date: ____________ Movements: ____________ Start time: ____________ Finish time: ____________ °Date: ____________ Movements: ____________ Start time: ____________ Finish time: ____________ °Date: ____________ Movements: ____________ Start time: ____________ Finish time: ____________ °Date: ____________ Movements: ____________ Start time: ____________ Finish time: ____________  °Date: ____________ Movements: ____________ Start time: ____________ Finish time: ____________ °Date: ____________ Movements: ____________ Start time: ____________ Finish time: ____________ °Date: ____________ Movements: ____________ Start time: ____________ Finish time: ____________ °Date: ____________ Movements: ____________ Start time: ____________ Finish time: ____________ °Date: ____________ Movements: ____________ Start time: ____________ Finish time: ____________ °Date: ____________ Movements: ____________ Start time: ____________ Finish time: ____________ °Date: ____________ Movements: ____________ Start time: ____________ Finish time: ____________  °Date: ____________ Movements: ____________ Start time: ____________ Finish time: ____________ °Date: ____________ Movements: ____________ Start time: ____________ Finish time: ____________ °Date: ____________ Movements: ____________ Start time: ____________ Finish time: ____________ °Date:  ____________ Movements: ____________ Start time: ____________ Finish time: ____________ °Date: ____________ Movements: ____________ Start time: ____________ Finish time: ____________ °Date: ____________ Movements: ____________ Start time: ____________ Finish time: ____________ °Document Released: 10/12/2006 Document Revised: 08/29/2012 Document Reviewed: 07/09/2012 °[employer]® Patient Information ©2014 [employer]. ° °

## 2013-06-07 NOTE — Telephone Encounter (Signed)
Patient here at clinic today for prenatal visit, all questions answered.

## 2013-06-07 NOTE — Progress Notes (Signed)
Decreased FM. NST category I. GBS pos, Vanc. Growth [place] 64%. Clarified that delivery of first baby was atraumatic. No shoulder dystocia. Second stage lasted 1-1/2 hours. Nuchal cord x1.

## 2013-06-07 NOTE — Progress Notes (Signed)
Pulse- 77 Patient reports lower abdominal/groin pain & pressure; states she feels the baby move its just not as often

## 2013-06-10 ENCOUNTER — Inpatient Hospital Stay (HOSPITAL_COMMUNITY)
Admission: AD | Admit: 2013-06-10 | Discharge: 2013-06-11 | Disposition: A | Discharge status: Home / Self Care | Payer: Managed Care, Other (non HMO) | Source: Ambulatory Visit | Attending: Obstetrics and Gynecology | Admitting: Obstetrics and Gynecology

## 2013-06-10 ENCOUNTER — Encounter (HOSPITAL_COMMUNITY): Payer: Self-pay | Admitting: *Deleted

## 2013-06-10 ENCOUNTER — Telehealth: Payer: Self-pay | Admitting: *Deleted

## 2013-06-10 DIAGNOSIS — R002 Palpitations: Secondary | ICD-10-CM | POA: Insufficient documentation

## 2013-06-10 DIAGNOSIS — O9A213 Injury, poisoning and certain other consequences of external causes complicating pregnancy, third trimester: Secondary | ICD-10-CM

## 2013-06-10 DIAGNOSIS — I517 Cardiomegaly: Secondary | ICD-10-CM | POA: Insufficient documentation

## 2013-06-10 DIAGNOSIS — W010XXA Fall on same level from slipping, tripping and stumbling without subsequent striking against object, initial encounter: Secondary | ICD-10-CM | POA: Insufficient documentation

## 2013-06-10 DIAGNOSIS — O99891 Other specified diseases and conditions complicating pregnancy: Secondary | ICD-10-CM | POA: Insufficient documentation

## 2013-06-10 DIAGNOSIS — Y92009 Unspecified place in unspecified non-institutional (private) residence as the place of occurrence of the external cause: Secondary | ICD-10-CM | POA: Insufficient documentation

## 2013-06-10 NOTE — MAU Provider Note (Signed)
Chief Complaint:  No chief complaint on file.  First Provider Initiated Contact with Patient 06/10/13 2227     HPI: Carol [name] is a 22 y.o. G2P1001 at [redacted]w[redacted]d who presents to maternity admissions reporting tripping while stepping over a baby gate this morning at 11:00 am and bumping her left arm , but not uterine area. Patient also reports increasingly frequently and prolonged episodes of palpitations over the last few days with very frequent episodes throughout the day today. Has had these episodes in the past. Normal EKG and Holter monitoring approximately 2 and half years ago per patient. No echo done. Mild contractions. No change from baseline. Denies chest pain, shortness of breath, dizziness, abdominal pain leakage of fluid or vaginal bleeding. Good fetal movement.   Pregnancy Course: Uncomplicated.  Past Medical History: Past Medical History  Diagnosis Date  . Pseudotumor cerebri   . 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 Panic attacks     Past obstetric history: OB History  Gravida Para Term Preterm AB SAB TAB Ectopic Multiple Living  2 1 1  0 0 0 0 0 0 1    # Outcome Date GA Lbr Len/2nd Weight Sex Delivery Anes PTL Lv  2 CUR           1 TRM 07/20/12 [redacted]w[redacted]d  2.551 kg (5 lb 10 oz) M SVD EPI  Y     Comments: neonatal seizures. patient had high BP x1 prenatal visit & was induced; 1.5 hr second stage. No shoulder dystocia. [facility]      Past Surgical History: Past Surgical History  Procedure Laterality Date  . Lumbar puncture    . Pyloromyotomy    . Tonsillectomy    . Addenoidectomy       Family History: Family History  Problem Relation Age of Onset  . Asthma Father   . Asthma Sister   . Mental illness Sister   . Diabetes Maternal Grandmother   . Cancer Paternal Grandmother   . Diabetes Paternal Grandfather     Social History: History  Substance Use Topics  . Smoking status: Never Smoker   .  Smokeless tobacco: Never Used  . Alcohol Use: No    Allergies:  Allergies  Allergen Reactions  . Penicillins Anaphylaxis    Meds:  Prescriptions prior to admission  Medication Sig Dispense Refill  . Prenatal Vit-Fe Fumarate-FA (PRENATAL MULTIVITAMIN) TABS tablet Take 1 tablet by mouth daily at 12 noon.        ROS: Pertinent findings in history of present illness.  Physical Exam  Blood pressure 106/68, pulse 86, temperature 97.9 F (36.6 C), temperature source Oral, resp. rate 18, height 4\' 11"  (1.499 m), weight 111.755 kg (246 lb 6 oz), last menstrual period 09/04/2012, SpO2 96.00%. GENERAL: Well-developed, well-nourished female in no acute distress.  HEENT: normocephalic HEART: normal rate and rhythm. No murmurs rubs or gallops. RESP: normal effort ABDOMEN: Soft, non-tender, gravid appropriate for gestational age. No bruising, tenderness or abrasions. EXTREMITIES: Nontender, no edema. No bruising, tenderness or abrasions. NEURO: alert and oriented SPECULUM EXAM: Deferred Dilation:  (ext os 3cm, int os FT) Effacement (%): 70 Cervical Position: Middle Station: -2 Presentation: Vertex Exam by:: K. weiss RN  FHT:  Baseline 120 , moderate variability, accelerations present, no decelerations Contractions: Irregular, mild   Labs: Results for orders placed during the hospital encounter of 06/10/13 (from the past 24 hour(s))  CBC  Status: Abnormal   Collection Time    06/11/13 12:10 AM      Result Value Range   WBC 10.2  4.0 - 10.5 K/uL   RBC 4.29  3.87 - 5.11 MIL/uL   Hemoglobin 10.9 (*) 12.0 - 15.0 g/dL   HCT 81.1 (*) 91.4 - 78.2 %   MCV 78.1  78.0 - 100.0 fL   MCH 25.4 (*) 26.0 - 34.0 pg   MCHC 32.5  30.0 - 36.0 g/dL   RDW 95.6  21.3 - 08.6 %   Platelets 196  150 - 400 K/uL   EKG: Normal sinus rhythm Left atrial enlargement Borderline ECG  Preliminary review by black box Dr.: Normal.  Imaging:  N/A.  MAU Course: No palpitations while in  MAU.  Assessment: 1. Traumatic injury during pregnancy in third trimester   2. Heart palpitations    Plan: Discharge home in stable condition. Abruption precautions. Recommend followup with cardiologist if palpitations worsen or continue significantly after delivery. TSH pending. Labor precautions and fetal kick counts     Follow-up Information   Follow up with [facility] On 06/13/2013.   Specialty:  Obstetrics and Gynecology   Contact information:   [address] [place] [address] [phone]      Follow up with [facility]. (As needed if symptoms worsen)    Contact information:   [id number] [place] [address] [phone]       Medication List         prenatal multivitamin Tabs tablet  Take 1 tablet by mouth daily at 12 noon.        Downs, CNM 06/11/2013 12:43 AM

## 2013-06-10 NOTE — MAU Note (Addendum)
PT SAYS AT   1130- TODAY  - SHE WAS HOLDING HER SON  AND WAS TRYING TO GO OVER GATE- BUT LEG GOT CAUGHT- TRIPPED-  CAUGHT HERSELF ON TABLE.  SHE DID NOT HIT ABD   NOR HEAD.   SHE FELT A POP IN VAGINA.   THEN AT 1 PM - SHE AWOKE  WITH A THROBBING PAIN  IN HER VAGINA  AND STILL FEELS A PINCH WHEN SHE WALKS.      SAYS  ALSO  HER LOWER BACK HURTS TOO.    NO MEDS FOR PAIN.  PNC AT [facility]-    SEEN LAST - ON Friday .   VE 1 CM.     DENIES HSV AND MRSA.  SAYS ALSO FEELS HEART PALPITaTIONS.-   STARTED 2 MTHS AGO-- BUT TODAY FEELS MORE.

## 2013-06-10 NOTE — Telephone Encounter (Signed)
Patient left message stating that she is very uncomfortable and felt something funny happen today. She is also short of breath. Left her a message stating that she should go to MAU if she is still experiencing those symptoms and to call back if she needs further assistance.

## 2013-06-11 ENCOUNTER — Encounter (HOSPITAL_COMMUNITY): Payer: Self-pay | Admitting: Advanced Practice Midwife

## 2013-06-11 LAB — CBC
HCT: 33.5 % — ABNORMAL LOW (ref 36.0–46.0)
Hemoglobin: 10.9 g/dL — ABNORMAL LOW (ref 12.0–15.0)
MCH: 25.4 pg — ABNORMAL LOW (ref 26.0–34.0)
MCHC: 32.5 g/dL (ref 30.0–36.0)
MCV: 78.1 fL (ref 78.0–100.0)
RDW: 14.7 % (ref 11.5–15.5)

## 2013-06-11 LAB — TSH: TSH: 1.45 u[IU]/mL (ref 0.350–4.500)

## 2013-06-11 LAB — COMPREHENSIVE METABOLIC PANEL
Albumin: 2.4 g/dL — ABNORMAL LOW (ref 3.5–5.2)
Alkaline Phosphatase: 110 U/L (ref 39–117)
BUN: 10 mg/dL (ref 6–23)
Calcium: 9 mg/dL (ref 8.4–10.5)
Creatinine, Ser: 0.46 mg/dL — ABNORMAL LOW (ref 0.50–1.10)
GFR calc Af Amer: >90 mL/min (ref >90)
Glucose, Bld: 83 mg/dL (ref 70–99)
Potassium: 4 mEq/L (ref 3.5–5.1)
Total Protein: 6.1 g/dL (ref 6.0–8.3)

## 2013-06-12 NOTE — MAU Provider Note (Signed)
Attestation of Attending Supervision of Advanced Practitioner (CNM/NP): Evaluation and management procedures were performed by the Advanced Practitioner under my supervision and collaboration.  I have reviewed the Advanced Practitioner's note and chart, and I agree with the management and plan.  Shamiah Kahler 06/12/2013 2:19 PM

## 2013-06-13 ENCOUNTER — Ambulatory Visit (INDEPENDENT_AMBULATORY_CARE_PROVIDER_SITE_OTHER): Payer: Managed Care, Other (non HMO) | Admitting: Obstetrics & Gynecology

## 2013-06-13 VITALS — BP 115/75 | Wt 245.2 lb

## 2013-06-13 DIAGNOSIS — O480 Post-term pregnancy: Secondary | ICD-10-CM

## 2013-06-13 DIAGNOSIS — B951 Streptococcus, group B, as the cause of diseases classified elsewhere: Secondary | ICD-10-CM

## 2013-06-13 DIAGNOSIS — Z3493 Encounter for supervision of normal pregnancy, unspecified, third trimester: Secondary | ICD-10-CM

## 2013-06-13 DIAGNOSIS — O98819 Other maternal infectious and parasitic diseases complicating pregnancy, unspecified trimester: Secondary | ICD-10-CM

## 2013-06-13 LAB — POCT URINALYSIS DIP (DEVICE)
Glucose, UA: NEGATIVE mg/dL
Ketones, ur: NEGATIVE mg/dL
Protein, ur: NEGATIVE mg/dL
Specific Gravity, Urine: >=1.03 (ref 1.005–1.030)

## 2013-06-13 NOTE — Progress Notes (Signed)
NST performed today was reviewed and was found to be reactive.  Continue recommended antenatal testing and prenatal care. No other complaints or concerns.  Fetal movement and labor precautions reviewed.

## 2013-06-13 NOTE — Patient Instructions (Signed)
Return to clinic for any obstetric concerns or go to MAU for evaluation  

## 2013-06-13 NOTE — Progress Notes (Signed)
Pulse- 77 Patient reports pelvic pressure & contractions

## 2013-06-17 ENCOUNTER — Ambulatory Visit (INDEPENDENT_AMBULATORY_CARE_PROVIDER_SITE_OTHER): Payer: Managed Care, Other (non HMO) | Admitting: *Deleted

## 2013-06-17 VITALS — BP 112/68

## 2013-06-17 DIAGNOSIS — O480 Post-term pregnancy: Secondary | ICD-10-CM

## 2013-06-17 NOTE — Progress Notes (Signed)
P = 92 

## 2013-06-19 ENCOUNTER — Encounter (HOSPITAL_COMMUNITY): Payer: Self-pay | Admitting: Family

## 2013-06-19 ENCOUNTER — Inpatient Hospital Stay (HOSPITAL_COMMUNITY)
Admission: AD | Admit: 2013-06-19 | Discharge: 2013-06-19 | Disposition: A | Discharge status: Home / Self Care | Payer: Managed Care, Other (non HMO) | Source: Ambulatory Visit | Attending: Obstetrics & Gynecology | Admitting: Obstetrics & Gynecology

## 2013-06-19 ENCOUNTER — Inpatient Hospital Stay (HOSPITAL_COMMUNITY): Payer: Managed Care, Other (non HMO)

## 2013-06-19 DIAGNOSIS — O479 False labor, unspecified: Secondary | ICD-10-CM

## 2013-06-19 DIAGNOSIS — O368131 Decreased fetal movements, third trimester, fetus 1: Secondary | ICD-10-CM

## 2013-06-19 DIAGNOSIS — N949 Unspecified condition associated with female genital organs and menstrual cycle: Secondary | ICD-10-CM | POA: Insufficient documentation

## 2013-06-19 DIAGNOSIS — O368190 Decreased fetal movements, unspecified trimester, not applicable or unspecified: Secondary | ICD-10-CM | POA: Insufficient documentation

## 2013-06-19 LAB — URINALYSIS, ROUTINE W REFLEX MICROSCOPIC
Ketones, ur: NEGATIVE mg/dL
Leukocytes, UA: NEGATIVE
Nitrite: NEGATIVE
Protein, ur: NEGATIVE mg/dL
pH: 6 (ref 5.0–8.0)

## 2013-06-19 LAB — POCT FERN TEST: POCT Fern Test: NEGATIVE

## 2013-06-19 NOTE — Progress Notes (Signed)
9/22 NST reviewed and reactive 

## 2013-06-19 NOTE — MAU Provider Note (Signed)
Chief Complaint:  Decreased Fetal Movement and Vaginal Discharge     HPI: Carol [name] is a 22 y.o. G2P1001 at [redacted]w[redacted]d who presents to maternity admissions reporting decreased FM x 1 day. Having increased amount vaginal discharge. No gush.  Denies contractions, leakage of fluid or vaginal bleeding.   Pregnancy Course: PNC at [facility]; short interval between pregnancies, late to care. Of note she is aware her 25 wk [place] dates her 2 wks less advanced (EDD 9/29) and states clinic doctors reviewed measurements and felt 27 wk was best EGA as pt is short stature.   Past Medical History: Past Medical History  Diagnosis Date  . Pseudotumor cerebri   . 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 Panic attacks     Past obstetric history: OB History  Gravida Para Term Preterm AB SAB TAB Ectopic Multiple Living  2 1 1  0 0 0 0 0 0 1    # Outcome Date GA Lbr Len/2nd Weight Sex Delivery Anes PTL Lv  2 CUR           1 TRM 07/20/12 [redacted]w[redacted]d  2.551 kg (5 lb 10 oz) M SVD EPI  Y     Comments: neonatal seizures. patient had high BP x1 prenatal visit & was induced; 1.5 hr second stage. No shoulder dystocia.  regional      Past Surgical History: Past Surgical History  Procedure Laterality Date  . Lumbar puncture    . Pyloromyotomy    . Tonsillectomy    . Addenoidectomy       Family History: Family History  Problem Relation Age of Onset  . Asthma Father   . Asthma Sister   . Mental illness Sister   . Diabetes Maternal Grandmother   . Cancer Paternal Grandmother   . Diabetes Paternal Grandfather     Social History: History  Substance Use Topics  . Smoking status: Never Smoker   . Smokeless tobacco: Never Used  . Alcohol Use: No    Allergies:  Allergies  Allergen Reactions  . Penicillins Anaphylaxis    Meds:  Prescriptions prior to admission  Medication Sig Dispense Refill  . acetaminophen (TYLENOL) 325 MG tablet Take 650 mg  by mouth every 6 (six) hours as needed for pain.        ROS: Pertinent findings in history of present illness.  Physical Exam  Blood pressure 109/64, pulse 91, temperature 97.8 F (36.6 C), temperature source Oral, resp. rate 18, height 4' 11.5" (1.511 m), weight 112.492 kg (248 lb), last menstrual period 09/04/2012. GENERAL: Well-developed, well-nourished female in no acute distress.  HEENT: normocephalic HEART: normal rate RESP: normal effort ABDOMEN: Soft, non-tender, gravid appropriate for gestational age EXTREMITIES: Nontender, no edema NEURO: alert and oriented   FHT:  Baseline 125-130 , moderate variability, accelerations present, no decelerations Contractions: occ, mild   Labs: Results for orders placed during the hospital encounter of 06/19/13 (from the past 24 hour(s))  URINALYSIS, ROUTINE W REFLEX MICROSCOPIC     Status: None   Collection Time    06/19/13  1:20 PM      Result Value Range   Color, Urine YELLOW  YELLOW   APPearance CLEAR  CLEAR   Specific Gravity, Urine 1.025  1.005 - 1.030   pH 6.0  5.0 - 8.0   Glucose, UA NEGATIVE  NEGATIVE mg/dL   Hgb urine dipstick NEGATIVE  NEGATIVE   Bilirubin Urine  NEGATIVE  NEGATIVE   Ketones, ur NEGATIVE  NEGATIVE mg/dL   Protein, ur NEGATIVE  NEGATIVE mg/dL   Urobilinogen, UA 0.2  0.0 - 1.0 mg/dL   Nitrite NEGATIVE  NEGATIVE   Leukocytes, UA NEGATIVE  NEGATIVE  POCT FERN TEST     Status: None   Collection Time    06/19/13  3:07 PM      Result Value Range   POCT Fern Test Negative = intact amniotic membranes      Imaging:  US Ob Follow Up  06/03/2013   OBSTETRICAL ULTRASOUND: This exam was performed within a [place] [facility]. The OB US report was generated in the AS system, and faxed to the ordering physician.   This report is also available in [employer] and in the [employer]. See AS Obstetric US report.  US Fetal Bpp W/o Non Stress  06/19/2013   OBSTETRICAL ULTRASOUND: This  exam was performed within a [place] [facility]. The OB US report was generated in the AS system, and faxed to the ordering physician.   This report is also available in [employer] and in the [employer]. See AS Obstetric US report. BPP6/8 (-2 gross body movements) AFI 9.5, 30th %ile, low normal per Dr. Sherrie George who recommends repeat NST tomorrow if NST NR  MAU Course:  NST reactive; feels little or no FM Discussed with Dr. Debroah Loop schedule IOL in 1-2 days.   Assessment: 1. Decreased fetal movement in pregnancy, antepartum, third trimester, fetus 1     Plan: Discharge home Labor precautions and fetal kick counts  Follow-up Information   Follow up with [facility] In 1 day. (If fetal movement is decreased, keep your clinic appointment tomorrow. You are scheduled for induction 9/26 Fri at 0715.)    Specialty:  Obstetrics and Gynecology   Contact information:   [address] [place] [address] [phone]       Medication List         acetaminophen 325 MG tablet  Commonly known as:  TYLENOL  Take 650 mg by mouth every 6 (six) hours as needed for pain.        Danae Orleans, CNM 06/19/2013 4:30 PM

## 2013-06-19 NOTE — MAU Note (Signed)
Woke up at 0800, clear fluid noted in underwear, continues to leak, did not put a pad on but underwear is wet and cont to leak.  Hasn't felt any movement since yesterday afternoon.Marland Kitchen

## 2013-06-19 NOTE — MAU Note (Signed)
22 yo G2P1 at [redacted]w[redacted]d, presents to MAU with c/o clear fluid noted at 0800 today and also decreased fetal movement. Denies VB, HSV or contractions.

## 2013-06-20 ENCOUNTER — Telehealth (HOSPITAL_COMMUNITY): Payer: Self-pay | Admitting: *Deleted

## 2013-06-20 ENCOUNTER — Ambulatory Visit (INDEPENDENT_AMBULATORY_CARE_PROVIDER_SITE_OTHER): Payer: Managed Care, Other (non HMO) | Admitting: Obstetrics and Gynecology

## 2013-06-20 ENCOUNTER — Encounter: Payer: Self-pay | Admitting: Obstetrics and Gynecology

## 2013-06-20 VITALS — BP 120/79 | Wt 248.1 lb

## 2013-06-20 DIAGNOSIS — Z3493 Encounter for supervision of normal pregnancy, unspecified, third trimester: Secondary | ICD-10-CM

## 2013-06-20 DIAGNOSIS — Z8759 Personal history of other complications of pregnancy, childbirth and the puerperium: Secondary | ICD-10-CM

## 2013-06-20 DIAGNOSIS — O480 Post-term pregnancy: Secondary | ICD-10-CM

## 2013-06-20 DIAGNOSIS — B951 Streptococcus, group B, as the cause of diseases classified elsewhere: Secondary | ICD-10-CM

## 2013-06-20 DIAGNOSIS — Z8742 Personal history of other diseases of the female genital tract: Secondary | ICD-10-CM

## 2013-06-20 DIAGNOSIS — O98819 Other maternal infectious and parasitic diseases complicating pregnancy, unspecified trimester: Secondary | ICD-10-CM

## 2013-06-20 DIAGNOSIS — O0933 Supervision of pregnancy with insufficient antenatal care, third trimester: Secondary | ICD-10-CM

## 2013-06-20 DIAGNOSIS — O0930 Supervision of pregnancy with insufficient antenatal care, unspecified trimester: Secondary | ICD-10-CM

## 2013-06-20 LAB — POCT URINALYSIS DIP (DEVICE)
Nitrite: NEGATIVE
Protein, ur: NEGATIVE mg/dL
pH: 6.5 (ref 5.0–8.0)

## 2013-06-20 NOTE — Progress Notes (Signed)
P=93  . States had MAU visit yesterday for decreased fetal movement, induction scheduled for 06/21/13. C/o lots of pelvic pressure and pain in vaginal area.

## 2013-06-20 NOTE — Progress Notes (Signed)
NST reviewed and reactive. Patient doing well without complaints. Reviewed FM/labor precautions. Patient reports good FM thus far. Advised patient to come to MAU for any concern. Patient scheduled for IOL tomorrow AM

## 2013-06-20 NOTE — Telephone Encounter (Signed)
Preadmission screen  

## 2013-06-21 ENCOUNTER — Inpatient Hospital Stay (HOSPITAL_COMMUNITY): Payer: Managed Care, Other (non HMO) | Admitting: Anesthesiology

## 2013-06-21 ENCOUNTER — Encounter (HOSPITAL_COMMUNITY): Payer: Self-pay

## 2013-06-21 ENCOUNTER — Inpatient Hospital Stay (HOSPITAL_COMMUNITY)
Admission: AD | Admit: 2013-06-21 | Discharge: 2013-06-26 | DRG: 774 | Disposition: A | Discharge status: Home / Self Care | Payer: Managed Care, Other (non HMO) | Source: Ambulatory Visit | Attending: Obstetrics & Gynecology | Admitting: Obstetrics & Gynecology

## 2013-06-21 ENCOUNTER — Encounter (HOSPITAL_COMMUNITY): Payer: Self-pay | Admitting: Anesthesiology

## 2013-06-21 VITALS — BP 115/81 | HR 88 | Temp 97.9°F | Resp 18 | Ht 59.0 in | Wt 248.0 lb

## 2013-06-21 DIAGNOSIS — O99892 Other specified diseases and conditions complicating childbirth: Secondary | ICD-10-CM | POA: Diagnosis present

## 2013-06-21 DIAGNOSIS — O480 Post-term pregnancy: Principal | ICD-10-CM | POA: Diagnosis present

## 2013-06-21 DIAGNOSIS — O10919 Unspecified pre-existing hypertension complicating pregnancy, unspecified trimester: Secondary | ICD-10-CM | POA: Diagnosis present

## 2013-06-21 DIAGNOSIS — O1092 Unspecified pre-existing hypertension complicating childbirth: Secondary | ICD-10-CM | POA: Diagnosis present

## 2013-06-21 DIAGNOSIS — R519 Headache, unspecified: Secondary | ICD-10-CM

## 2013-06-21 DIAGNOSIS — R51 Headache: Secondary | ICD-10-CM

## 2013-06-21 DIAGNOSIS — Z880 Allergy status to penicillin: Secondary | ICD-10-CM

## 2013-06-21 DIAGNOSIS — G971 Other reaction to spinal and lumbar puncture: Secondary | ICD-10-CM | POA: Diagnosis not present

## 2013-06-21 DIAGNOSIS — G932 Benign intracranial hypertension: Secondary | ICD-10-CM | POA: Diagnosis present

## 2013-06-21 DIAGNOSIS — Z22330 Carrier of Group B streptococcus: Secondary | ICD-10-CM

## 2013-06-21 DIAGNOSIS — O898 Other complications of anesthesia during the puerperium: Secondary | ICD-10-CM | POA: Diagnosis not present

## 2013-06-21 DIAGNOSIS — B951 Streptococcus, group B, as the cause of diseases classified elsewhere: Secondary | ICD-10-CM

## 2013-06-21 LAB — CBC
Hemoglobin: 11.1 g/dL — ABNORMAL LOW (ref 12.0–15.0)
MCH: 24.9 pg — ABNORMAL LOW (ref 26.0–34.0)
MCHC: 32.2 g/dL (ref 30.0–36.0)
Platelets: 207 10*3/uL (ref 150–400)
RDW: 15.6 % — ABNORMAL HIGH (ref 11.5–15.5)
WBC: 10.9 10*3/uL — ABNORMAL HIGH (ref 4.0–10.5)

## 2013-06-21 LAB — RPR: RPR Ser Ql: NONREACTIVE

## 2013-06-21 LAB — OB RESULTS CONSOLE GC/CHLAMYDIA: Gonorrhea: NEGATIVE

## 2013-06-21 LAB — TYPE AND SCREEN: Antibody Screen: NEGATIVE

## 2013-06-21 MED ORDER — TERBUTALINE SULFATE 1 MG/ML IJ SOLN: 0.2500 mg | Freq: Once | INTRAMUSCULAR | Status: AC | PRN

## 2013-06-21 MED ORDER — EPHEDRINE 5 MG/ML INJ
10.0000 mg | INTRAVENOUS | Status: DC | PRN
Start: 2013-06-21 — End: 2013-06-22
  Administered 2013-06-21: 15 mg via INTRAVENOUS

## 2013-06-21 MED ORDER — OXYTOCIN BOLUS FROM INFUSION
500.0000 mL | INTRAVENOUS | Status: DC
  Administered 2013-06-22: 500 mL via INTRAVENOUS

## 2013-06-21 MED ORDER — OXYTOCIN 40 UNITS IN LACTATED RINGERS INFUSION - SIMPLE MED: 62.5000 mL/h | INTRAVENOUS | Status: DC

## 2013-06-21 MED ORDER — DIPHENHYDRAMINE HCL 50 MG/ML IJ SOLN: 12.5000 mg | INTRAMUSCULAR | Status: DC | PRN

## 2013-06-21 MED ORDER — VANCOMYCIN HCL IN DEXTROSE 1-5 GM/200ML-% IV SOLN
1000.0000 mg | Freq: Two times a day (BID) | INTRAVENOUS | Status: DC
  Administered 2013-06-21 (×2): 1000 mg via INTRAVENOUS
  Filled 2013-06-21 (×3): qty 200

## 2013-06-21 MED ORDER — OXYTOCIN 40 UNITS IN LACTATED RINGERS INFUSION - SIMPLE MED
1.0000 m[IU]/min | INTRAVENOUS | Status: DC
  Administered 2013-06-21: 2 m[IU]/min via INTRAVENOUS
  Filled 2013-06-21: qty 1000

## 2013-06-21 MED ORDER — EPHEDRINE 5 MG/ML INJ
10.0000 mg | INTRAVENOUS | Status: DC | PRN
  Filled 2013-06-21: qty 4

## 2013-06-21 MED ORDER — MISOPROSTOL 25 MCG QUARTER TABLET
25.0000 ug | ORAL_TABLET | ORAL | Status: DC | PRN
  Administered 2013-06-21: 25 ug via VAGINAL
  Filled 2013-06-21: qty 0.25

## 2013-06-21 MED ORDER — ACETAMINOPHEN 325 MG PO TABS
650.0000 mg | ORAL_TABLET | ORAL | Status: DC | PRN
  Administered 2013-06-22: 650 mg via ORAL
  Filled 2013-06-21: qty 2

## 2013-06-21 MED ORDER — PHENYLEPHRINE 40 MCG/ML (10ML) SYRINGE FOR IV PUSH (FOR BLOOD PRESSURE SUPPORT)
80.0000 ug | PREFILLED_SYRINGE | INTRAVENOUS | Status: DC | PRN
Start: 2013-06-21 — End: 2013-06-22
  Filled 2013-06-21: qty 5

## 2013-06-21 MED ORDER — LIDOCAINE HCL (PF) 1 % IJ SOLN
30.0000 mL | INTRAMUSCULAR | Status: DC | PRN
  Filled 2013-06-21: qty 30

## 2013-06-21 MED ORDER — OXYCODONE-ACETAMINOPHEN 5-325 MG PO TABS
1.0000 | ORAL_TABLET | ORAL | Status: DC | PRN
  Administered 2013-06-22 (×2): 1 via ORAL
  Filled 2013-06-21 (×2): qty 1

## 2013-06-21 MED ORDER — LACTATED RINGERS IV SOLN
INTRAVENOUS | Status: DC
  Administered 2013-06-21 – 2013-06-22 (×2): via INTRAVENOUS

## 2013-06-21 MED ORDER — LACTATED RINGERS IV SOLN
500.0000 mL | INTRAVENOUS | Status: DC | PRN
  Administered 2013-06-21: 500 mL via INTRAVENOUS

## 2013-06-21 MED ORDER — FENTANYL 2.5 MCG/ML BUPIVACAINE 1/10 % EPIDURAL INFUSION (WH - ANES)
14.0000 mL/h | INTRAMUSCULAR | Status: DC | PRN
  Administered 2013-06-22: 14 mL/h via EPIDURAL
  Filled 2013-06-21 (×3): qty 125

## 2013-06-21 MED ORDER — PHENYLEPHRINE 40 MCG/ML (10ML) SYRINGE FOR IV PUSH (FOR BLOOD PRESSURE SUPPORT): 80.0000 ug | PREFILLED_SYRINGE | INTRAVENOUS | Status: DC | PRN

## 2013-06-21 MED ORDER — FENTANYL CITRATE 0.05 MG/ML IJ SOLN
100.0000 ug | INTRAMUSCULAR | Status: DC | PRN
  Administered 2013-06-21: 100 ug via INTRAVENOUS
  Filled 2013-06-21: qty 2

## 2013-06-21 MED ORDER — LACTATED RINGERS IV SOLN
500.0000 mL | Freq: Once | INTRAVENOUS | Status: AC
  Administered 2013-06-21: 500 mL via INTRAVENOUS

## 2013-06-21 MED ORDER — IBUPROFEN 600 MG PO TABS
600.0000 mg | ORAL_TABLET | Freq: Four times a day (QID) | ORAL | Status: DC | PRN
  Administered 2013-06-22: 600 mg via ORAL
  Filled 2013-06-21: qty 1

## 2013-06-21 MED ORDER — CLINDAMYCIN PHOSPHATE 900 MG/50ML IV SOLN
900.0000 mg | Freq: Three times a day (TID) | INTRAVENOUS | Status: DC
  Filled 2013-06-21 (×2): qty 50

## 2013-06-21 MED ORDER — ONDANSETRON HCL 4 MG/2ML IJ SOLN: 4.0000 mg | Freq: Four times a day (QID) | INTRAMUSCULAR | Status: DC | PRN

## 2013-06-21 MED ORDER — CITRIC ACID-SODIUM CITRATE 334-500 MG/5ML PO SOLN: 30.0000 mL | ORAL | Status: DC | PRN

## 2013-06-21 MED ORDER — FENTANYL 2.5 MCG/ML BUPIVACAINE 1/10 % EPIDURAL INFUSION (WH - ANES)
INTRAMUSCULAR | Status: DC | PRN
  Administered 2013-06-21: 14 mL/h via EPIDURAL

## 2013-06-21 NOTE — Progress Notes (Signed)
Carol [name] is a 22 y.o. G2P1001 at [redacted]w[redacted]d admitted for induction of labor due to Post dates..  Subjective: STarting to feel ctx. No acute issues  Objective: BP 116/70  Pulse 75  Temp(Src) 97.9 F (36.6 C) (Oral)  Resp 18  Ht 4\' 11"  (1.499 m)  Wt 112.492 kg (248 lb)  BMI 50.06 kg/m2  LMP 09/04/2012      FHT:  FHR: 130s bpm, variability: moderate,  accelerations:  Present,  decelerations:  Absent UC:    q56min SVE:   Dilation: 1 Effacement (%): 70 Station: -2 Exam by:: Dr Ike Bene Not repeated Labs: Lab Results  Component Value Date   WBC 10.9* 06/21/2013   HGB 11.1* 06/21/2013   HCT 34.5* 06/21/2013   MCV 77.5* 06/21/2013   PLT 207 06/21/2013    Assessment / Plan: Induction of labor due to postterm,  progressing well on pitocin  Labor: recheck in 2hrs Preeclampsia:  no signs or symptoms of toxicity Fetal Wellbeing:  Category I Pain Control:  Epidural and Fentanyl as needed I/D:  GBS Positive Anticipated MOD:  NSVD  Carol [name] RYAN 06/21/2013, 12:21 PM

## 2013-06-21 NOTE — Anesthesia Procedure Notes (Addendum)
Epidural Patient location during procedure: OB Start time: 06/21/2013 4:40 PM End time: 06/21/2013 4:50 PM  Staffing Anesthesiologist: Sandrea Hughs Performed by: anesthesiologist   Preanesthetic Checklist Completed: patient identified, site marked, surgical consent, pre-op evaluation, timeout performed, IV checked, risks and benefits discussed and monitors and equipment checked  Epidural Patient position: sitting Prep: site prepped and draped and DuraPrep Patient monitoring: continuous pulse ox and blood pressure Approach: midline Injection technique: LOR air  Needle:  Needle type: Tuohy  Needle gauge: 17 G Needle length: 9 cm and 9 Needle insertion depth: 8 cm Catheter type: closed end flexible Catheter size: 19 Gauge Catheter at skin depth: 14 cm Test dose: negative and Other  Assessment Sensory level: T9 Events: blood not aspirated, injection not painful, no injection resistance, negative IV test and no paresthesia  Additional Notes Performed by Dr. Lequita Asal for block:procedure for pain  Epidural Patient location during procedure: OB  Preanesthetic Checklist Completed: patient identified, site marked, surgical consent, pre-op evaluation, timeout performed, IV checked, risks and benefits discussed and monitors and equipment checked  Epidural Patient position: sitting Prep: site prepped and draped and DuraPrep Patient monitoring: continuous pulse ox and blood pressure Approach: midline Injection technique: LOR air  Needle:  Needle type: Tuohy  Needle gauge: 17 G Needle length: 9 cm and 9 Needle insertion depth: 5 cm cm Catheter type: closed end flexible Catheter size: 19 Gauge Catheter at skin depth: 10 cm Test dose: negative  Assessment Events: blood not aspirated, injection not painful, no injection resistance, negative IV test and no paresthesia

## 2013-06-21 NOTE — H&P (Cosign Needed)
Carol [name] is a 22 y.o. female presenting for IOL for Post dates. Pt is otherwise doing well. Good FM, no LOF, No VB, No Ctx. Pt has no complaints at this time. Pt reports with prior delivery infant had an issue with a clot causing a stroke and seizures after delivery. Delivery was 10months ago.  Maternal Medical History:  Reason for admission: Nausea.    OB History   Grav Para Term Preterm Abortions TAB SAB Ect Mult Living   2 1 1  0 0 0 0 0 0 1     Past Medical History  Diagnosis Date  . Pseudotumor cerebri   . 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 Panic attacks    Past Surgical History  Procedure Laterality Date  . Lumbar puncture    . Pyloromyotomy    . Tonsillectomy    . Addenoidectomy     Family History: family history includes Asthma in her father and sister; Cancer in her paternal grandmother; Diabetes in her maternal grandmother and paternal grandfather; Mental illness in her sister. Social History:  reports that she has never smoked. She has never used smokeless tobacco. She reports that she does not drink alcohol or use illicit drugs.   Review of Systems  Constitutional: Negative for fever and chills.  Eyes: Negative for blurred vision.  Respiratory: Negative for cough.   Cardiovascular: Negative for chest pain.  Gastrointestinal: Negative for heartburn, nausea, vomiting and abdominal pain.  Genitourinary: Negative for flank pain.  Neurological: Negative for headaches.   No complaints at this time  VTX by Exam Dilation: 1 Effacement (%): 70 Station: -2 Exam by:: Dr Ike Bene Blood pressure 116/70, pulse 75, temperature 98.5 F (36.9 C), temperature source Oral, resp. rate 18, height 4\' 11"  (1.499 m), weight 112.492 kg (248 lb), last menstrual period 09/04/2012. Exam Physical Exam  Nursing note and vitals reviewed. Constitutional: She appears well-developed and well-nourished. No distress.  HENT:   Head: Normocephalic and atraumatic.  Eyes: Conjunctivae and EOM are normal.  Neck: Normal range of motion. Neck supple.  Cardiovascular: Normal rate, regular rhythm, normal heart sounds and intact distal pulses.  Exam reveals no gallop and no friction rub.   No murmur heard. Respiratory: Effort normal and breath sounds normal. No respiratory distress. She has no wheezes. She has no rales. She exhibits no tenderness.  GI: Soft. Bowel sounds are normal. She exhibits no distension (gravid, leopold 3800g) and no mass. There is no tenderness. There is no rebound and no guarding.  Genitourinary: Vagina normal.  Skin: She is not diaphoretic.    FHT: 130s mod variability, Multi accels >15x15 no decels. Toco: Starting ctx. q3-62min  Prenatal labs: ABO, Rh: O/POS/-- (05/21 1543) Antibody: NEG (05/21 1543) Rubella: 4.89 (05/21 1543) RPR: NON REAC (05/21 1543)  HBsAg: NEGATIVE (05/21 1543)  HIV: NON REACTIVE (05/21 1543)  GBS: Positive (08/28 0000)   Assessment/Plan: Carol [name] is a 22 y.o. G2P1001 at [redacted]w[redacted]d Presents for IOL for PD  #Labor: Will start induction with cytotec 25 PV #Pain: Desires IV or Epidural #FWB: Cat I #ID: GBS +, PCN allergy, with res to clinda. Will tx with vanc #MOF: Desires breasst #MOC: Considering mirena   Doreen Garretson RYAN 06/21/2013, 11:14 AM

## 2013-06-21 NOTE — Progress Notes (Signed)
Carol [name] is a 22 y.o. G2P1001 at [redacted]w[redacted]d admitted for induction of labor due to Post dates..  Subjective: S/p epidural. Comfortable no acute issues History of vaginal delivery with baby having seizures afterward. Unknown cause. Doing well now. >> consult MFM re: recommended mode of delivery  >> They do not recommend Primary C/S, but would have a low threshold for C/S if pt falls off the curve   They would not do a vacuum Objective: BP 122/85  Pulse 85  Temp(Src) 98.1 F (36.7 C) (Oral)  Resp 18  Ht 4\' 11"  (1.499 m)  Wt 112.492 kg (248 lb)  BMI 50.06 kg/m2  SpO2 99%  LMP 09/04/2012      FHT:  FHR: 150s bpm, variability: moderate,  accelerations:  Present,  decelerations:  Absent UC:    q67min SVE:   Dilation: 3.5 Effacement (%): 80 Station: -2 Exam by:: Ace Gins, RN  Labs: Lab Results  Component Value Date   WBC 10.9* 06/21/2013   HGB 11.1* 06/21/2013   HCT 34.5* 06/21/2013   MCV 77.5* 06/21/2013   PLT 207 06/21/2013    Assessment / Plan: Induction of labor due to postterm,  progressing well on pitocin  Labor: not palpating strong, ctx appear to be inverted. Will consider pitocin Preeclampsia:  no signs or symptoms of toxicity Fetal Wellbeing:  Category I Pain Control:  Epidural  I/D:  GBS Positive on Vanc Anticipated MOD:  NSVD  History of vaginal delivery with baby having seizures afterward. Unknown cause. Doing well now. >> consult MFM re: recommended mode of delivery  >> They do not recommend Primary C/S, but would have a low threshold for C/S if pt falls off the curve   They would not do a vacuum  Carol [name] 06/21/2013, 7:40 PM

## 2013-06-21 NOTE — Progress Notes (Signed)
Patient ID: Carol [name], female   DOB: 09-14-1991, 22 y.o.   MRN: [id number] LABOR PROGRESS NOTE  Carol [name] is a 22 y.o. G2P1001 at [redacted]w[redacted]d  admitted for induction of labor due to Post dates. Due date 06/11/13.  Subjective: Pt doing well. Comfortable with epidural now.   Objective: BP 83/42  Pulse 112  Temp(Src) 98.1 F (36.7 C) (Oral)  Resp 20  Ht 4\' 11"  (1.499 m)  Wt 112.492 kg (248 lb)  BMI 50.06 kg/m2  SpO2 99%  LMP 09/04/2012    FHT:  FHR: 130s bpm, variability: moderate,  accelerations:  Present,  decelerations:  Absent UC:   irregular, every 1-4 minutes SVE:   Dilation: 4 Effacement (%): 90 Station: -2 Exam by:: Horst Ostermiller MD   Labs: Lab Results  Component Value Date   WBC 10.9* 06/21/2013   HGB 11.1* 06/21/2013   HCT 34.5* 06/21/2013   MCV 77.5* 06/21/2013   PLT 207 06/21/2013    Assessment / Plan: IOL for postdates. s/p cytotec. now with irregular contraction pattern  Labor: will start low dose pit and AROM performed with moderate amount of thin mec fluid. IUPC placed for better contraction monitoring  Fetal Wellbeing:  Category I Pain Control:  Epidural ID: on vanc s/p 2nd dose. Given PCN allergy and clinda resistance Anticipated MOD:  NSVD  Carol Gunnoe L, MD 06/21/2013, 9:45 PM

## 2013-06-21 NOTE — Anesthesia Preprocedure Evaluation (Signed)
Anesthesia Evaluation  Patient identified by MRN, date of birth, ID band Patient awake    Reviewed: Allergy & Precautions, H&P , Patient's Chart, lab work & pertinent test results  Airway Mallampati: II TM Distance: >3 FB Neck ROM: full    Dental no notable dental hx.    Pulmonary neg pulmonary ROS,    Pulmonary exam normal       Cardiovascular negative cardio ROS      Neuro/Psych negative neurological ROS     GI/Hepatic negative GI ROS, Neg liver ROS,   Endo/Other  negative endocrine ROS  Renal/GU negative Renal ROS  negative genitourinary   Musculoskeletal negative musculoskeletal ROS (+)   Abdominal Normal abdominal exam  (+)   Peds  Hematology negative hematology ROS (+)   Anesthesia Other Findings   Reproductive/Obstetrics (+) Pregnancy                           Anesthesia Physical Anesthesia Plan  ASA: II  Anesthesia Plan: Epidural   Post-op Pain Management:    Induction:   Airway Management Planned:   Additional Equipment:   Intra-op Plan:   Post-operative Plan:   Informed Consent: I have reviewed the patients History and Physical, chart, labs and discussed the procedure including the risks, benefits and alternatives for the proposed anesthesia with the patient or authorized representative who has indicated his/her understanding and acceptance.     Plan Discussed with:   Anesthesia Plan Comments:         Anesthesia Quick Evaluation

## 2013-06-22 ENCOUNTER — Encounter (HOSPITAL_COMMUNITY): Payer: Self-pay

## 2013-06-22 DIAGNOSIS — O480 Post-term pregnancy: Secondary | ICD-10-CM

## 2013-06-22 DIAGNOSIS — G971 Other reaction to spinal and lumbar puncture: Secondary | ICD-10-CM

## 2013-06-22 DIAGNOSIS — G932 Benign intracranial hypertension: Secondary | ICD-10-CM

## 2013-06-22 MED ORDER — LANOLIN HYDROUS EX OINT: TOPICAL_OINTMENT | CUTANEOUS | Status: DC | PRN

## 2013-06-22 MED ORDER — MEASLES, MUMPS & RUBELLA VAC ~~LOC~~ INJ: 0.5000 mL | INJECTION | Freq: Once | SUBCUTANEOUS | Status: DC

## 2013-06-22 MED ORDER — OXYCODONE-ACETAMINOPHEN 5-325 MG PO TABS
1.0000 | ORAL_TABLET | ORAL | Status: DC | PRN
  Administered 2013-06-22 (×2): 2 via ORAL
  Administered 2013-06-23 (×2): 1 via ORAL
  Administered 2013-06-24 – 2013-06-26 (×9): 2 via ORAL
  Filled 2013-06-22: qty 1
  Filled 2013-06-22 (×11): qty 2
  Filled 2013-06-22: qty 1

## 2013-06-22 MED ORDER — SIMETHICONE 80 MG PO CHEW
80.0000 mg | CHEWABLE_TABLET | ORAL | Status: DC | PRN
  Administered 2013-06-24: 80 mg via ORAL

## 2013-06-22 MED ORDER — ONDANSETRON HCL 4 MG PO TABS: 4.0000 mg | ORAL_TABLET | ORAL | Status: DC | PRN

## 2013-06-22 MED ORDER — DIPHENHYDRAMINE HCL 25 MG PO CAPS: 25.0000 mg | ORAL_CAPSULE | Freq: Four times a day (QID) | ORAL | Status: DC | PRN

## 2013-06-22 MED ORDER — ONDANSETRON HCL 4 MG/2ML IJ SOLN: 4.0000 mg | INTRAMUSCULAR | Status: DC | PRN

## 2013-06-22 MED ORDER — ZOLPIDEM TARTRATE 5 MG PO TABS: 5.0000 mg | ORAL_TABLET | Freq: Every evening | ORAL | Status: DC | PRN

## 2013-06-22 MED ORDER — IBUPROFEN 600 MG PO TABS
600.0000 mg | ORAL_TABLET | Freq: Four times a day (QID) | ORAL | Status: DC
  Administered 2013-06-22 – 2013-06-26 (×17): 600 mg via ORAL
  Filled 2013-06-22 (×16): qty 1

## 2013-06-22 MED ORDER — DIBUCAINE 1 % RE OINT: 1.0000 "application " | TOPICAL_OINTMENT | RECTAL | Status: DC | PRN

## 2013-06-22 MED ORDER — PRENATAL MULTIVITAMIN CH
1.0000 | ORAL_TABLET | Freq: Every day | ORAL | Status: DC
  Administered 2013-06-22 – 2013-06-26 (×3): 1 via ORAL
  Filled 2013-06-22: qty 1

## 2013-06-22 MED ORDER — TETANUS-DIPHTH-ACELL PERTUSSIS 5-2.5-18.5 LF-MCG/0.5 IM SUSP: 0.5000 mL | Freq: Once | INTRAMUSCULAR | Status: DC

## 2013-06-22 MED ORDER — INFLUENZA VAC SPLIT QUAD 0.5 ML IM SUSP: 0.5000 mL | INTRAMUSCULAR | Status: AC

## 2013-06-22 MED ORDER — SENNOSIDES-DOCUSATE SODIUM 8.6-50 MG PO TABS
2.0000 | ORAL_TABLET | Freq: Every day | ORAL | Status: DC
  Administered 2013-06-24: 2 via ORAL

## 2013-06-22 MED ORDER — BENZOCAINE-MENTHOL 20-0.5 % EX AERO: 1.0000 "application " | INHALATION_SPRAY | CUTANEOUS | Status: DC | PRN

## 2013-06-22 MED ORDER — WITCH HAZEL-GLYCERIN EX PADS: 1.0000 "application " | MEDICATED_PAD | CUTANEOUS | Status: DC | PRN

## 2013-06-22 NOTE — Lactation Note (Signed)
This note was copied from the chart of Carol Chiropractor. Lactation Consultation Note  Patient Name: Carol [name] Today's Date: 06/22/2013 Reason for consult: Initial assessment of this mom and baby at 17 hours postpartum. Mom states her  first baby is 65 months old, was in NICU x3 weeks and mom unable to latch but pumped for 3 weeks; states it was too difficult to continue pumping after baby was discharged home.  This new baby has latched well for several feedings since birth, of 10-15 minutes per feeding.  Only LATCH score recorded by RN was when baby too sleepy to latch but mom reports baby latching well at other feedings with strong sucking bursts. LC provided [employer] brochure and reviewed [facility] services and list of community and web site resources. LC discussed normal newborn sleepiness during first 24 hours and recommends STS and cue feedings, with review of cues to watch for.      Maternal Data Formula Feeding for Exclusion: No Infant to breast within first hour of birth: Yes (fed for 10 minutes, per initial feeding record) Has patient been taught Hand Expression?: Yes (experienced with her first, now 48 months old) Does the patient have breastfeeding experience prior to this delivery?: Yes  Feeding Feeding Type: Breast Milk Length of feed: 15 min  LATCH Score/Interventions           only LATCH score=5 when baby sleepy           Lactation Tools Discussed/Used   STS, cue feedings, hand expression  Consult Status Consult Status: Follow-up Date: 06/23/13 Follow-up type: In-patient    Warrick Parisian [facility] 06/22/2013, 10:28 PM

## 2013-06-22 NOTE — Progress Notes (Signed)
Sharonann Bethann Goo is a 22 y.o. G2P1001 at [redacted]w[redacted]d admitted for induction of labor due to Post dates.   Subjective:  Pt comfortable s/p epidural. Doing well.   Objective: BP 104/47  Pulse 105  Temp(Src) 98.6 F (37 C) (Oral)  Resp 20  Ht 4\' 11"  (1.499 m)  Wt 112.492 kg (248 lb)  BMI 50.06 kg/m2  SpO2 99%  LMP 09/04/2012   Total I/O In: -  Out: 550 [Urine:550]  FHT:  FHR: 150 bpm, variability: moderate,  accelerations:  Present,  decelerations:  Present variables UC:   Regular, 180 mvu SVE:   Dilation: Lip/rim Effacement (%): 90 Station: +1 Exam by:: dr Reola Calkins  Labs: Lab Results  Component Value Date   WBC 10.9* 06/21/2013   HGB 11.1* 06/21/2013   HCT 34.5* 06/21/2013   MCV 77.5* 06/21/2013   PLT 207 06/21/2013    Assessment / Plan: IOL for postdates, now progressing well on pit  Labor: Progressing normally Fetal Wellbeing:  Category II Pain Control:  Epidural I/D:  GBS pos on vanc due to allergies Anticipated MOD:  NSVD  Eugenie Harewood L 06/22/2013, 2:38 AM

## 2013-06-22 NOTE — Anesthesia Postprocedure Evaluation (Signed)
  Anesthesia Post-op Note  Patient: Carol [name]  Procedure(s) Performed: * No procedures listed *  Patient Location: Mother/Baby  Anesthesia Type:Epidural  Level of Consciousness: awake  Airway and Oxygen Therapy: Patient Spontanous Breathing  Post-op Pain: mild  Post-op Assessment: Patient's Cardiovascular Status Stable and Respiratory Function Stable  Post-op Vital Signs: stable  Complications: No apparent anesthesia complications

## 2013-06-23 LAB — CBC
Platelets: 149 10*3/uL — ABNORMAL LOW (ref 150–400)
RBC: 3.81 MIL/uL — ABNORMAL LOW (ref 3.87–5.11)
RDW: 15.3 % (ref 11.5–15.5)
WBC: 11.4 10*3/uL — ABNORMAL HIGH (ref 4.0–10.5)

## 2013-06-23 MED ORDER — BUTALBITAL-APAP-CAFFEINE 50-325-40 MG PO TABS
2.0000 | ORAL_TABLET | ORAL | Status: DC | PRN
  Administered 2013-06-23 – 2013-06-24 (×5): 2 via ORAL
  Filled 2013-06-23 (×6): qty 2

## 2013-06-23 NOTE — Lactation Note (Signed)
This note was copied from the chart of Girl Chiropractor. Lactation Consultation Note Mom reports that she pumped for 3 weeks with current 28 month old.  She states that she has tubular asymmetrical breast and baby seems more fussy on left breast.  Moms says she has a bruise on left nipple, but has been able to identify that as a shallow latch and has improved since.  She is able to express colostrum from both sides.  Upon breast assessment LC notes asymmetrical somewhat tubular breast on left side with some glandular tissue.  She reports being able to pump up to 2 oz with older child.  Mom is encouraged to feed well on right side first and then attempt left breast to allow for a better let down and hopefully decrease baby's fussiness on left side.  Encouraged to feed with cues and reviewed guidelines for adequate milk supply.  Mom had many questions regarding supply and Lc answered and referred to baby and me booklet for printed instructions. Encouraged to keep a record of feedings and output to help her keep up.  Mom will call for assistance as needed.   LC to follow up prior to discharge.  Encouraged to come to support group.  Patient Name: Girl Vincent Ehrler [id number] Date: 06/23/2013 Reason for consult: Follow-up assessment   Maternal Data    Feeding    LATCH Score/Interventions                      Lactation Tools Discussed/Used     Consult Status Consult Status: Follow-up Date: 06/24/13 Follow-up type: In-patient    Warrick Parisian [facility] 06/23/2013, 9:10 PM

## 2013-06-23 NOTE — Progress Notes (Signed)
Patient's mother is at bedside and requested someone evaluate her daughter's headache due to her history of a "pseudo tumor".  Patient had complained of a headache =2 today, but states it is "not as bad" as it has been. Called the CRNA and reported the patient's concern, she will let the Anesthesiologist know and ask them to evaluate patient.

## 2013-06-23 NOTE — Progress Notes (Signed)
Post Partum Day 1 Subjective: Eating, drinking, voiding, ambulating well.  +flatus.  Lochia and pain wnl.  Denies dizziness, lightheadedness, or sob. Has had frontal HA since yest evening that gets better when she lays down and worse when she sits up. She was seen by anesthesia last night, and states they were unsure if it was a spinal HA s/p epidural or not, and recommended increasing caffeine.  Pt feels like the combination of percocet and ibuprofen have helped it some. Was rating 8/10 last night, now 2/10. Also reports h/o pseudotumor cerebri w/o any problems during this pregnancy.   Objective: Blood pressure 96/65, pulse 76, temperature 97.6 F (36.4 C), temperature source Oral, resp. rate 20, height 4\' 11"  (1.499 m), weight 112.492 kg (248 lb), last menstrual period 09/04/2012, SpO2 99.00%, unknown if currently breastfeeding.  Physical Exam:  General: alert, cooperative and no distress Lochia: appropriate Uterine Fundus: firm Incision: n/a DVT Evaluation: No evidence of DVT seen on physical exam. Negative Homan's sign. No cords or calf tenderness. No significant calf/ankle edema.   Recent Labs  06/21/13 0820 06/23/13 0605  HGB 11.1* 9.4*  HCT 34.5* 29.5*    Assessment/Plan: Push caffeinated fluids, will discuss HA in rounds. Breastfeeding well. Undecided about contraception, reviewed options with breastfeeding. Plan for d/c tomorrow.    LOS: 2 days   Marge Duncans 06/23/2013, 7:41 AM

## 2013-06-23 NOTE — Addendum Note (Signed)
Addendum created 06/23/13 1314 by Gaylan Gerold, MD   Modules edited: Anesthesia Responsible Staff

## 2013-06-23 NOTE — Progress Notes (Signed)
Clinical Social Work Department PSYCHOSOCIAL ASSESSMENT - MATERNAL/CHILD 06/23/2013  Patient:  Carol [name]  Account Number:  [id number]  Admit Date:  06/21/2013  Marjo Bicker Name:   Carol [name]    Clinical Social Worker:  Jadrian Bulman, LCSW   Date/Time:  06/23/2013 09:30 AM  Date Referred:  06/22/2013   Referral source  Central Nursery     Referred reason  Depression/Anxiety   Other referral source:    I:  FAMILY / HOME ENVIRONMENT Child's legal guardian:  PARENT  Guardian - Name Guardian - Age Guardian - Address  Carol [name] [address]  [place], [place] [address]  Carol [name]  [address]  [place], [place] [address]   Other household support members/support persons Other support:   Aunt currently caring for 107 month old child    II  PSYCHOSOCIAL DATA Information Source:  Patient Interview  Insurance claims handler Resources Employment:   FOB is employed   Surveyor, quantity resources:  Media planner If [employer] - [place]:    School / Grade:   Maternity Care Coordinator / Child Services Coordination / Early Interventions:  Cultural issues impacting care:   None reported    III  STRENGTHS Strengths  Adequate Resources  Home prepared for Child (including basic supplies)  Supportive family/friends   Strength comment:    IV  RISK FACTORS AND CURRENT PROBLEMS Current Problem:     Risk Factor & Current Problem Patient Issue Family Issue Risk Factor / Current Problem Comment   N N     V  SOCIAL WORK ASSESSMENT Acknowledged order for Social Work consult to assess pt's history of "panic attacks".    Mother was receptive to social work intervention.  FOB was present during social work visit.  Mother states that she had a panic attach while in the delivery room and it took about 45 minutes for her to calm down.  Prior to this, she notes that she last experienced a panic attack about 2 years ago.  She denies any hx of medication for the panic  attacks and stated that she is usually able to calm herself down.  She denies any hx of psychiatric hospitalization and states that she received counseling in middle school for anger management.  No other mental health treatment noted.  She denies any current symptoms of depression.  .  She denies any hx of substance abuse.    Discussed signs/symptoms of PP depression with parents, and provided resources if needed.   No acute social concerns reported or noted at this time.  Parents informed of social work Surveyor, mining.      VI SOCIAL WORK PLAN Social Work Plan  No Further Intervention Required / No Barriers to Discharge   Type of pt/family education:   If child protective services report - county:   If child protective services report - date:   Information/referral to community resources comment:   Pediatrician: [employer], Carol Childers J, LCSW

## 2013-06-23 NOTE — Progress Notes (Addendum)
Post-dural Puncture Headache Evaluation  Pt sitting in bed without obvious discomfort. She endorses a headache of 2/10 when sitting that is most noticeable when ambulating. It is relieved by lying flat. She states that caffeine is marginally helpful. We discussed the subjective nature in the diagnosis of PDPH and treatment with conservative measures (fluid, analgesics, caffeine, etc.) and epidural puncture headache. Pt unsure if she desires blood patch at this time. I answered all of her questions and she will contact anesthesia with further questions.   I went back to further discuss epidural blood patch with Carol [name]. She was sitting up comfortably eating lunch. She continued to express reluctance to do the procedure. I said that it is not unreasonable to attempt conservative measures and that she can return at any time for a blood patch if necessary. She is in favor of this plan.

## 2013-06-24 MED ORDER — IBUPROFEN 600 MG PO TABS: 600.0000 mg | ORAL_TABLET | Freq: Four times a day (QID) | ORAL | Status: DC

## 2013-06-24 MED ORDER — LIDOCAINE HCL (PF) 0.5 % IJ SOLN
INTRAMUSCULAR | Status: DC | PRN
  Administered 2013-06-21 (×2): 9 mL via INTRAVENOUS

## 2013-06-24 MED ORDER — BUTALBITAL-APAP-CAFFEINE 50-325-40 MG PO TABS: 1.0000 | ORAL_TABLET | Freq: Two times a day (BID) | ORAL | Status: DC | PRN

## 2013-06-24 MED ORDER — SUMATRIPTAN SUCCINATE 50 MG PO TABS
50.0000 mg | ORAL_TABLET | ORAL | Status: AC
  Administered 2013-06-24 (×2): 50 mg via ORAL
  Filled 2013-06-24 (×2): qty 1

## 2013-06-24 NOTE — Discharge Summary (Addendum)
Obstetric Discharge Summary Reason for Admission: induction of labor Prenatal Procedures: ultrasound Intrapartum Procedures: spontaneous vaginal delivery Postpartum Procedures: none Complications-Operative and Postpartum: none Hemoglobin  Date Value Range Status  06/23/2013 9.4* 12.0 - 15.0 g/dL Final     HCT  Date Value Range Status  06/23/2013 29.5* 36.0 - 46.0 % Final   Carol [name] was admitted for induction of labor due to post dates. She received cytotec x1 and was started on pitocin. She received an epidural. Labor was uncomplicated. Delivery was also uncomplicated. Postpartum course was complicated by a headache, which was treated with Fioricet. She is breastfeeding, and is unsure about contraceptive method.  Physical Exam:  General: alert, cooperative and no distress Lochia: appropriate Uterine Fundus: firm Incision: n/a DVT Evaluation: No evidence of DVT seen on physical exam.  Discharge Diagnoses: Term Pregnancy-delivered  Discharge Information: Date: 06/24/2013 Activity: pelvic rest Diet: routine Medications: PNV and Ibuprofen; Fiorocet Condition: stable Instructions: refer to practice specific booklet Discharge to: home   Newborn Data: Live born female  Birth Weight: 7 lb 14.8 oz (3595 g) APGAR: 9, 9  Home with mother.  Carol [name] 06/24/2013, 10:03 AM  I have seen and examined this patient and agree with above documentation in the resident's note. Pt and fiance have decided on vasectomy for contraception.    Rulon Abide, M.D. [facility] Fellow 06/24/2013 1:22 PM   Of note, pt now with headache that is excrutiating and concerning for pseudotumor cerebrii per anesthesia. Pt with a hx of this in the past as well. Will keep overnight and plan for neuro consult.   Adan Baehr, Redmond Baseman, MD

## 2013-06-24 NOTE — Lactation Note (Signed)
This note was copied from the chart of Girl Chiropractor. Lactation Consultation Note  Patient Name: Girl Abiola Behring [id number] Date: 06/24/2013 Reason for consult: Follow-up assessment Mom reports some mild nipple tenderness, no breakdown noted. Care for sore nipples reviewed. Comfort gels given with instructions. Engorgement care reviewed if needed. Advised of OP services and support group.   Maternal Data    Feeding Feeding Type: Breast Milk Length of feed: 15 min  LATCH Score/Interventions Latch: Grasps breast easily, tongue down, lips flanged, rhythmical sucking. Intervention(s): Skin to skin  Audible Swallowing: A few with stimulation  Type of Nipple: Everted at rest and after stimulation  Comfort (Breast/Nipple): Filling, red/small blisters or bruises, mild/mod discomfort  Problem noted: Mild/Moderate discomfort (EBM to nipples) Interventions (Mild/moderate discomfort): Comfort gels;Hand massage;Hand expression  Hold (Positioning): No assistance needed to correctly position infant at breast. Intervention(s): Breastfeeding basics reviewed;Support Pillows;Position options;Skin to skin  LATCH Score: 8  Lactation Tools Discussed/Used Tools: Pump;Comfort gels Breast pump type: Manual   Consult Status Consult Status: Complete Date: 06/24/13 Follow-up type: In-patient    Alfred Levins 06/24/2013, 9:51 AM

## 2013-06-24 NOTE — Addendum Note (Signed)
Addendum created 06/24/13 1533 by Sandrea Hughs., MD   Modules edited: Anesthesia Attestations, Anesthesia Blocks and Procedures, Anesthesia LDA, Anesthesia Medication Administration

## 2013-06-24 NOTE — Progress Notes (Signed)
Pt seen by Dr Lilli Light for continuous HA. Dr Lilli Light request Attending Doctor to have the pt evaluated either by neurologist or ophthalmologist to evaluate if pt has increase pressure behind eye due to previous condition.  Dr Jacques Earthly called he was in a delivery unable to talk to RN . Left message for Dr Jacques Earthly to call this RN.

## 2013-06-24 NOTE — Discharge Summary (Signed)
Attestation of Attending Supervision of Fellow: Evaluation and management procedures were performed by the Fellow under my supervision and collaboration.  I have reviewed the Fellow's note and chart, and I agree with the management and plan.    

## 2013-06-24 NOTE — Progress Notes (Signed)
Subjective: Patient reports throbbing headache with jabbing pain in back of eyes. Has some photophobia. Fioricet has not helped with symptoms. Says her symptoms are similar to her previous episode of pseudotumor cerebri symptoms, which was treated with a spinal tap.  Objective: I have reviewed patient's vital signs.  General: alert, cooperative, fatigued and no distress HEENT: PEERL, could not visualize optic discs  Assessment: Carol [name] is a 22 y.o. [id number] PPD#2 s/p NVSD with severe headache  Plan: - Imitrex 50mg  PO x1 - consult neurology   LOS: 3 days    [name] 06/24/2013, 6:19 PM

## 2013-06-24 NOTE — Progress Notes (Signed)
Dr Jacques Earthly called. Relayed Dr Hatchett's evaluation and recommendations. Dr Jacques Earthly to come see patient.

## 2013-06-25 ENCOUNTER — Inpatient Hospital Stay (HOSPITAL_COMMUNITY)
Admission: AD | Admit: 2013-06-25 | Discharge: 2013-06-25 | Disposition: A | Discharge status: Home / Self Care | Payer: Managed Care, Other (non HMO) | Source: Ambulatory Visit | Attending: Neurology | Admitting: Neurology

## 2013-06-25 DIAGNOSIS — IMO0002 Reserved for concepts with insufficient information to code with codable children: Secondary | ICD-10-CM

## 2013-06-25 DIAGNOSIS — R51 Headache: Secondary | ICD-10-CM

## 2013-06-25 DIAGNOSIS — R519 Headache, unspecified: Secondary | ICD-10-CM

## 2013-06-25 DIAGNOSIS — B951 Streptococcus, group B, as the cause of diseases classified elsewhere: Secondary | ICD-10-CM

## 2013-06-25 HISTORY — DX: Headache: R51

## 2013-06-25 MED ORDER — LORAZEPAM BOLUS VIA INFUSION
2.0000 mg | INTRAVENOUS | Status: DC | PRN
  Filled 2013-06-25: qty 2

## 2013-06-25 MED ORDER — LORAZEPAM BOLUS VIA INFUSION
1.0000 mg | INTRAVENOUS | Status: DC | PRN
  Filled 2013-06-25: qty 1

## 2013-06-25 MED ORDER — LORAZEPAM 2 MG/ML IJ SOLN
INTRAMUSCULAR | Status: AC
  Filled 2013-06-25: qty 1

## 2013-06-25 MED ORDER — LORAZEPAM 2 MG/ML IJ SOLN
1.0000 mg | INTRAMUSCULAR | Status: DC | PRN
  Administered 2013-06-25: 1 mg via INTRAVENOUS

## 2013-06-25 NOTE — Progress Notes (Signed)
Subjective: Patient reports that at this time the headache is improved.  It is no longer behind her eyes.  She reports that the headache is much worse when she is sitting up or standing.    Objective: Current vital signs: BP 114/73  Pulse 69  Temp(Src) 98.2 F (36.8 C) (Oral)  Resp 20  Ht 4\' 11"  (1.499 m)  Wt 112.492 kg (248 lb)  BMI 50.06 kg/m2  SpO2 98%  LMP 09/04/2012 Vital signs in last 24 hours: Temp:  [98 F (36.7 C)-98.2 F (36.8 C)] 98.2 F (36.8 C) (09/30 1478) Pulse Rate:  [69-75] 69 (09/30 0613) Resp:  [18-20] 20 (09/30 2956) BP: (103-114)/(69-73) 114/73 mmHg (09/30 2130)  Intake/Output from previous day:   Intake/Output this shift:   Nutritional status: General  Neurologic Exam: Mental Status:  Alert, oriented, thought content appropriate. Speech fluent without evidence of aphasia. Able to follow 3 step commands without difficulty.  Cranial Nerves:  II: Visual fields grossly normal, pupils equal, round, reactive to light and accommodation  III,IV, VI: ptosis not present, extra-ocular motions intact bilaterally  V,VII: smile symmetric, facial light touch sensation normal bilaterally  VIII: hearing normal bilaterally  IX,X: gag reflex present  XI: bilateral shoulder shrug  XII: midline tongue extension  Motor:  5/5 throughout Sensory: Pinprick and light touch intact throughout, bilaterally  Deep Tendon Reflexes:  2+ throughout Plantars:  Right: downgoing     Left: downgoing  Cerebellar:  normal finger-to-nose, normal heel-to-shin test   Lab Results: Basic Metabolic Panel: No results found for this basename: NA, K, CL, CO2, GLUCOSE, BUN, CREATININE, CALCIUM, MG, PHOS,  in the last 168 hours  Liver Function Tests: No results found for this basename: AST, ALT, ALKPHOS, BILITOT, PROT, ALBUMIN,  in the last 168 hours No results found for this basename: LIPASE, AMYLASE,  in the last 168 hours No results found for this basename: AMMONIA,  in the last 168  hours  CBC:  Recent Labs Lab 06/21/13 0820 06/23/13 0605  WBC 10.9* 11.4*  HGB 11.1* 9.4*  HCT 34.5* 29.5*  MCV 77.5* 77.4*  PLT 207 149*    Cardiac Enzymes: No results found for this basename: CKTOTAL, CKMB, CKMBINDEX, TROPONINI,  in the last 168 hours  Lipid Panel: No results found for this basename: CHOL, TRIG, HDL, CHOLHDL, VLDL, LDLCALC,  in the last 168 hours  CBG: No results found for this basename: GLUCAP,  in the last 168 hours  Microbiology: Results for orders placed in visit on 05/23/13  OB RESULTS CONSOLE GBS     Status: None   Collection Time    05/23/13 12:00 AM      Result Value Range Status   GBS Positive   Final  GC/CHLAMYDIA PROBE AMP     Status: None   Collection Time    05/23/13  3:41 PM      Result Value Range Status   CT Probe RNA NEGATIVE   Final   GC Probe RNA NEGATIVE   Final   Comment:  Normal Reference Range: Negative                 Assay performed using the Gen-Probe APTIMA COMBO2 (R) Assay.           Acceptable specimen types for this assay include APTIMA Swabs (Unisex,     endocervical, urethral, or vaginal), first void urine, and ThinPrep     liquid based cytology samples.  CULTURE, BETA STREP (GROUP B ONLY)     Status: None   Collection Time    05/23/13  3:41 PM      Result Value Range Status   Culture GROUP B STREP (S.AGALACTIAE) ISOLATED   Final   Comment: GENITAL   Organism ID, Bacteria GROUP B STREP (S.AGALACTIAE) ISOLATED   Final    Coagulation Studies: No results found for this basename: LABPROT, INR,  in the last 72 hours  Imaging: Mr Shirlee Latch Wo Contrast  06/25/2013   CLINICAL DATA:  Postpartum headaches. Pseudotumor cerebri. Normal neurologic exam.  EXAM: MRA HEAD WITHOUT CONTRAST  TECHNIQUE: MRA HEAD WITHOUT CONTRAST  COMPARISON:  None.  FINDINGS: The internal carotid arteries are widely patent. The basilar artery is widely  patent with vertebrals co-dominant. There is no intracranial stenosis or aneurysm. Fetal origin left PCA. No cerebellar branch occlusion.  IMPRESSION: Unremarkable MRA intracranial circulation.   Electronically Signed   By: Davonna Belling M.D.   On: 06/25/2013 13:10   Mr Brain Wo Contrast  06/25/2013   CLINICAL DATA:  Postpartum headaches. History of pseudotumor cerebri. Normal neurologic exam.  EXAM: MRI HEAD WITHOUT CONTRAST  TECHNIQUE: Multiplanar, multisequence MR imaging was performed. No intravenous contrast was administered.  COMPARISON:  None.  FINDINGS: No acute stroke or hemorrhage. No mass lesion or hydrocephalus. No extra-axial fluid. Normal cerebral volume. No white matter disease. No foci of chronic hemorrhage. No evidence for eclampsia related edema, or subarachnoid blood.  Physiologically prominent and upwardly convex pituitary gland without evidence for apoplexy. No chiasmatic compression. No evidence for venous sinus thrombosis.  Scalp and extracranial soft tissues unremarkable. Negative orbits, sinuses, and mastoids. No tonsillar herniation or effacement of the basilar cisterns.  IMPRESSION: Physiologic prominence of the pituitary in the postpartum state. No evidence for apoplexy, subarachnoid hemorrhage, eclampsia, or other acute intracranial finding.   Electronically Signed   By: Davonna Belling M.D.   On: 06/25/2013 13:16   Mr [place] Lodge  06/25/2013   CLINICAL DATA:  Postpartum headache. Normal neurologic exam. History of pseudotumor cerebri.  EXAM: MR HEAD VENOGRAM  TECHNIQUE: 2D time-of-flight technique was performed without contrast. All projection images were generated of the intracranial venous sinuses.  COMPARISON:  None.  FINDINGS: The superior sagittal sinus is widely patent. The internal cerebral veins, vein of Galen, and straight sinuses are widely patent. The right transverse sinus is dominant. The left transverse sinus is hypoplastic proximally but becomes a more normal caliber  distally. Both sigmoid sinuses are widely patent. There is no visible cortical venous thrombosis.  IMPRESSION: No evidence of venous sinus thrombosis. Normal variant hypoplastic proximal left transverse sinus.   Electronically Signed   By: Davonna Belling M.D.   On: 06/25/2013 13:09    Medications:  I have reviewed the patient's current medications. Scheduled: . ibuprofen  600 mg Oral Q6H  . measles, mumps and rubella vaccine  0.5 mL Subcutaneous Once  . prenatal multivitamin  1 tablet Oral Q1200  . senna-docusate  2 tablet Oral QHS  . TDaP  0.5 mL Intramuscular Once  Assessment/Plan: Headache improves with medications.  No visual field deficits noted.  Discs noted to be flat on examination.  MRI of the brain reviewed and shows no abnormalities.  MRV reviewed and normal as well.  Patient describes symptoms that are more consistent with post spinal headache than with pseudotumor.  Treatment options discussed with the patient.  Since this condition os often self-limited the patient wishes to remain on pain medications prn to see if the headaches will resolve on their own.  Recommendations: 1.  Patient will need prn po medication for headache 2.  F/U with neurology one week after discharge.  May either follow up with [facility] or [facility] 3.  No further neurologic intervention is recommended at this time.  If further questions arise, please call or page at that time.  Thank you for allowing neurology to participate in the care of this patient.    LOS: 4 days   Thana Farr, MD [facility] [phone] 06/25/2013  2:52 PM

## 2013-06-25 NOTE — Progress Notes (Signed)
Dr Leroy Kennedy paged, no return call.  Called Radiology at [facility], order reviewed..Radiology will call back in AM with schedule and information as to when and where patient to go

## 2013-06-25 NOTE — Consult Note (Signed)
NEURO HOSPITALIST CONSULT NOTE    Reason for Consult: HA  HPI:                                                                                                                                          Carol [name] is an 22 y.o. female with a past medical history for pseudotumor cerebri, panic attacks, s/p uneventful vaginal delivery 3 days ago and subsequent development of HA with a pattern that I am going to describe below. She indicated that she was experiencing mild HA during the last week of pregnancy, but " right after delivery I started having severe, pounding, debilitating HA that is not going away". Said that the HA is constant, involves the whole head, goes behind the eyes, it is worsened by standing and walking, gets better lying flat, and is not associated with nausea, vomiting, vertigo, double vision, focal weakness or numbness, confusion, slurred speech, language or vision impairment. No fever, chills, or skin rash. Fioricet did not help and now takes percocet that apparently is not helping much. Overall, she believes that the HA has some similarity with her " initial crisis of pseudotumor cerebri, although perhaps less intense'. Regarding her diagnosis of pseudotumor cerebri, she expressed that this was established by LP 7 years ago. She took " a diuretic only briefly because I have been pregnant twice". Last time she saw her neurologist was October 2013 and she had unremarkable visual field testing. Did well until this ongoing HA. A blood patch was advised by anesthesiology but she declined and opted for more conservative treatment.  No brain imaging studies available for review.    Past Medical History  Diagnosis Date  . Pseudotumor cerebri   . 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 Panic attacks     Past Surgical History  Procedure Laterality Date  . Lumbar puncture    . Pyloromyotomy    .  Tonsillectomy    . Addenoidectomy      Family History  Problem Relation Age of Onset  . Asthma Father   . Asthma Sister   . Mental illness Sister   . Diabetes Maternal Grandmother   . Cancer Paternal Grandmother   . Diabetes Paternal Grandfather      Social History:  reports that she has never smoked. She has never used smokeless tobacco. She reports that she does not drink alcohol or use illicit drugs.  Allergies  Allergen Reactions  . Penicillins Anaphylaxis    MEDICATIONS:  I have reviewed the patient's current medications.   ROS:                                                                                                                                       History obtained from the patient and chart review.  General ROS: negative for - chills, fatigue, fever, night sweats, weight gain or weight loss Psychological ROS: negative for - behavioral disorder, hallucinations, memory difficulties, mood swings or suicidal ideation Ophthalmic ROS: negative for - blurry vision, double vision, or loss of vision ENT ROS: negative for - epistaxis, nasal discharge, oral lesions, sore throat, tinnitus or vertigo Allergy and Immunology ROS: negative for - hives or itchy/watery eyes Hematological and Lymphatic ROS: negative for - bleeding problems, bruising or swollen lymph nodes Endocrine ROS: negative for - galactorrhea, hair pattern changes, polydipsia/polyuria or temperature intolerance Respiratory ROS: negative for - cough, hemoptysis, shortness of breath or wheezing Cardiovascular ROS: negative for - chest pain, dyspnea on exertion, edema or irregular heartbeat Gastrointestinal ROS: negative for - abdominal pain, diarrhea, hematemesis, nausea/vomiting or stool incontinence Genito-Urinary ROS: negative for - dysuria, hematuria, incontinence or urinary  frequency/urgency Musculoskeletal ROS: negative for - joint swelling or muscular weakness Neurological ROS: as noted in HPI Dermatological ROS: negative for rash and skin lesion changes   Physical exam: pleasant female in no apparent distress. Blood pressure 103/69, pulse 75, temperature 98 F (36.7 C), temperature source Oral, resp. rate 18, height 4\' 11"  (1.499 m), weight 112.492 kg (248 lb), last menstrual period 09/04/2012, SpO2 98.00%, unknown if currently breastfeeding. Head: normocephalic. Neck: supple, no bruits, no JVD. Cardiac: no murmurs. Lungs: clear. Abdomen: soft, no tender, no mass. Extremities: no edema.  Neurologic Examination:                                                                                                      Mental Status: Alert, oriented, thought content appropriate.  Speech fluent without evidence of aphasia.  Able to follow 3 step commands without difficulty. Cranial Nerves: II: Discs flat bilaterally; Visual fields grossly normal, pupils equal, round, reactive to light and accommodation III,IV, VI: ptosis not present, extra-ocular motions intact bilaterally V,VII: smile symmetric, facial light touch sensation normal bilaterally VIII: hearing normal bilaterally IX,X: gag reflex present XI: bilateral shoulder shrug XII: midline tongue extension Motor: Right : Upper extremity   5/5    Left:     Upper extremity   5/5  Lower extremity   5/5  Lower extremity   5/5 Tone and bulk:normal tone throughout; no atrophy noted Sensory: Pinprick and light touch intact throughout, bilaterally Deep Tendon Reflexes:  Right: Upper Extremity   Left: Upper extremity   biceps (C-5 to C-6) 2/4   biceps (C-5 to C-6) 2/4 tricep (C7) 2/4    triceps (C7) 2/4 Brachioradialis (C6) 2/4  Brachioradialis (C6) 2/4  Lower Extremity Lower Extremity  quadriceps (L-2 to L-4) 2/4   quadriceps (L-2 to L-4) 2/4 Achilles (S1) 2/4   Achilles (S1) 2/4  Plantars: Right:  downgoing   Left: downgoing Cerebellar: normal finger-to-nose,  normal heel-to-shin test Gait:  No tested CV: pulses palpable throughout    No results found for this basename: cbc, bmp, coags, chol, tri, ldl, hga1c    Results for orders placed during the hospital encounter of 06/21/13 (from the past 48 hour(s))  CBC     Status: Abnormal   Collection Time    06/23/13  6:05 AM      Result Value Range   WBC 11.4 (*) 4.0 - 10.5 K/uL   RBC 3.81 (*) 3.87 - 5.11 MIL/uL   Hemoglobin 9.4 (*) 12.0 - 15.0 g/dL   HCT 16.1 (*) 09.6 - 04.5 %   MCV 77.4 (*) 78.0 - 100.0 fL   MCH 24.7 (*) 26.0 - 34.0 pg   MCHC 31.9  30.0 - 36.0 g/dL   RDW 40.9  81.1 - 91.4 %   Platelets 149 (*) 150 - 400 K/uL   Comment: REPEATED TO VERIFY     SPECIMEN CHECKED FOR CLOTS     DELTA CHECK NOTED    No results found.   Assessment/Plan: 22 y/o with history of pseudotumor cerebri and worsening HA after recent vaginal delivery with epidural anesthesia. No visual disturbances associated with current HA and her neuro-exam showed no papilledema or focal neurological findings . HA can be related to her underlying pseudotumor cerebri or post LP HA. Her neurological status doesn't seem to be consistent with cerebral venous thrombosis, reversible vasoconstriction syndrome, or subarachnoid HA. However, will suggest getting CT/CTA/CTV brain to exclude those potential but unlikely possibilities. She has not evidence of visual compromise at this moment, I thus it is reasonable to wait for the results of neuro-imaging and then decide about LP. Will follow up.  Wyatt Portela, MD [facility] [phone]  06/25/2013, 1:38 AM

## 2013-06-26 ENCOUNTER — Encounter (HOSPITAL_COMMUNITY)
Admission: RE | Admit: 2013-06-26 | Discharge: 2013-06-26 | Disposition: A | Discharge status: Home / Self Care | Payer: Managed Care, Other (non HMO) | Source: Ambulatory Visit | Attending: Family Medicine | Admitting: Family Medicine

## 2013-06-26 MED ORDER — ACETAZOLAMIDE 125 MG PO TABS: 250.0000 mg | ORAL_TABLET | Freq: Two times a day (BID) | ORAL | Status: DC

## 2013-06-26 MED ORDER — OXYCODONE-ACETAMINOPHEN 5-325 MG PO TABS: 1.0000 | ORAL_TABLET | ORAL | Status: DC | PRN

## 2013-06-26 MED ORDER — BUTALBITAL-APAP-CAFFEINE 50-325-40 MG PO TABS
1.0000 | ORAL_TABLET | Freq: Four times a day (QID) | ORAL | Status: DC
  Administered 2013-06-26 (×2): 1 via ORAL
  Filled 2013-06-26: qty 1

## 2013-06-26 MED ORDER — ACETAZOLAMIDE 250 MG PO TABS
250.0000 mg | ORAL_TABLET | Freq: Two times a day (BID) | ORAL | Status: DC
  Administered 2013-06-26: 250 mg via ORAL
  Filled 2013-06-26 (×2): qty 1

## 2013-06-26 NOTE — Progress Notes (Signed)
Patient requested neurologist consult. Attending MD notified of patient's request.

## 2013-06-26 NOTE — Discharge Summary (Cosign Needed Addendum)
Obstetric Discharge Summary Reason for Admission: onset of labor Prenatal Procedures: NST and ultrasound Intrapartum Procedures: spontaneous vaginal delivery Postpartum Procedures: none Complications-Operative and Postpartum: spinal headache Hemoglobin  Date Value Range Status  06/23/2013 9.4* 12.0 - 15.0 g/dL Final     HCT  Date Value Range Status  06/23/2013 29.5* 36.0 - 46.0 % Final    Physical Exam:  General: alert, cooperative and no distress Lochia: appropriate Uterine Fundus: firm Incision: healing well DVT Evaluation: No evidence of DVT seen on physical exam.  Discharge Diagnoses: Term Pregnancy-delivered and spinal headache   Discharge Information: Date: 06/26/2013 Activity: unrestricted Diet: routine Medications: PNV, Ibuprofen, Iron and Percocet Condition: stable Instructions: refer to practice specific booklet Discharge to: home Follow-up Information   Follow up with [facility]. Schedule an appointment as soon as possible for a visit in 5 weeks. (Postpartum follow-up)    Specialty:  Obstetrics and Gynecology   Contact information:   [address] [place] [address] [phone]      Follow up with [facility]. (As needed if symptoms worsen)    Contact information:   [address] [place] [address]     Pt was evaluated by Neurology and by anesthesia for severe headache. Pt had MRI that was negative for acute findings. Anesthesia recommended starting acetazolamide. Will start at low dose 250mg  BID and will have pt follow up with neurology Postpartum for continued management.   Newborn Data: Live born female  Birth Weight: 7 lb 14.8 oz (3595 g) APGAR: 9, 9  Home with mother.  Carol [name] 06/26/2013, 4:09 PM

## 2013-06-26 NOTE — Progress Notes (Signed)
Attending MD notified of anesthesiologist recommendation. Orders received for Fioricet every 6 hours. Will medicate and continue to monitor patient status.

## 2013-06-26 NOTE — Progress Notes (Signed)
Called to room. Patient crying with headache. Sensitive to light and sound. Unable to change position without extreme pounding headache.Unable to ambulate due to headache. Comfort and bedpan offered. RN notified attending MD of patient status. Boykin Peek, RN

## 2013-06-26 NOTE — Progress Notes (Signed)
Called to see patient regarding continual complaint of HA. Ms. Carol [name] had an epidural placed for Labor and vaginal delivery on 9/26. She delivered on 9/27 and started complaining of HA behind her eyes and top of head. She denied N/V, visual or hearing disturbances. Her HA was positional in nature and somewhat better in the supine position, but still present. There was no report of a dural puncture in her epidural procedure note. Significantly, she has a history of pseudotumor cerebri. She had an MRI/MRA which was normal. Neurology has seen the patient. Her HA is somewhat improved by analgesics but is still constant. She states that her HA is similar to when she was first diagnosed with pseudotumor cerebri. I do not feel that her headache represents a post dural puncture as there was no report of dural puncture when the epidural catheter was placed and with a 17ga needle it would have been pretty obvious. I am of the opinion that her headache is more of an exacerbation of her pseudotumor cerebri and could have been exacerbated by the volume of local anesthetic infused into the epidural space.  Would recommend continuing analgesics as needed. Would recommend Diamox if safe during lactation.

## 2013-06-26 NOTE — Progress Notes (Signed)
Per Lactation Consultant, Diamox is L2; acceptable for breastfeeding. Notified attending MD for further orders.

## 2013-06-26 NOTE — Progress Notes (Signed)
Call to anesthesiologist to assess patient for constant headache, worsening with positional change;. Anesthesiologist notified. Will come to assess patient as soon as possible.

## 2013-06-27 ENCOUNTER — Encounter: Payer: Self-pay | Admitting: Family

## 2013-07-01 ENCOUNTER — Ambulatory Visit: Payer: Self-pay

## 2013-07-01 NOTE — Lactation Note (Signed)
This note was copied from the chart of Carol [name]. Infant Lactation Consultation Outpatient Visit Note  Patient Name: Carol [name]                                            Mother : Kammy Klett Date of Birth: 06/22/2013                                                                        Pediatrician: [facility] Birth Weight:  7 lb 14.8 oz (3595 g) Gestational Age at Delivery: Gestational Age: [redacted]w[redacted]d Type of Delivery: Vag Weight today: 7lbs 3.2 oz (3266 gms) Breastfeeding History Frequency of Breastfeeding: 9-10/day Length of Feeding: 40 minutes Voids: 8-9/day Stools: 4-5/day, yellowish tan  Supplementing / Method: Pumping:  Type of Pump: [employer] rental pump   Frequency: has pumped 2-3 since d/c home (fullness)  Volume:  50-60 ml  Comments:    Went home using a nipple shield and has stopped the shield since 10/4 and only putting baby directly to the breast. Right and Left nipple has scabs on the tip of the nipple. Mother states nipples have improved and less painful after baby latches.   Consultation Evaluation:  Initial Feeding Assessment: Pre-feed Weight: 3266 Post-feed Weight: 3298 Amount Transferred: 32ml Comments: Right nipple is pink. Mother has very fair skin. Baby latches shallow. Assisted with depth on the breast and more tissue in mother's mouth. When baby came off the breast, small bleeding noted. Mother has been expressing her on milk and applying to her nipple as well as applying lanolin that she reports has promoted healing.  Additional Feeding Assessment: Pre-feed Weight: 3298 Post-feed Weight:3306 Amount Transferred: 8 ml Comments: Left nipple with scab on the tip. Assited mother in the cross cradle hold using an asymmetrical latch. Baby aggressively latches to the breast. Mother states she feels pain for the first 15-20 seconds and then it goes away once baby starts top feed. Mother instructed to continue to hold and  support the breast during the beginning of the feeding to avoid further trauma to the nipples. Baby is sleepy and sucking intermittently during this feeding.   Mother reports fullness in her breast between feedings. She has leaking before and, after the feeding while burping. Baby had a large void after this feeding session.  Total Breast milk Transferred this Visit: 40 ml Total Supplement Given: none  Additional Interventions:  Comfort gels given to promote healing of scabbed nipples. To use for 6-7 days then discontinue. Better support with hold, deeper latch and supportive pillows to keep baby at nipple line. Alternate breast massage with the feeding for milk transfer and emptying. To pump prn for fullness/ missed feedings  Follow-Up [facility] appointment  on 10/10 Mother will call the [facility] department if baby is not gaining well at ped appt or if nipples remain cracked and bleeding.     Christella Hartigan M 07/01/2013, 4:52 PM

## 2013-07-18 ENCOUNTER — Encounter: Payer: Self-pay | Admitting: *Deleted

## 2013-07-27 ENCOUNTER — Encounter (HOSPITAL_COMMUNITY)
Admission: RE | Admit: 2013-07-27 | Discharge: 2013-07-27 | Disposition: A | Discharge status: Home / Self Care | Payer: Managed Care, Other (non HMO) | Source: Ambulatory Visit | Attending: Family Medicine | Admitting: Family Medicine

## 2013-08-01 ENCOUNTER — Ambulatory Visit: Payer: Managed Care, Other (non HMO) | Admitting: Family

## 2013-10-11 ENCOUNTER — Encounter: Payer: Self-pay | Admitting: Medical

## 2013-10-11 ENCOUNTER — Ambulatory Visit (INDEPENDENT_AMBULATORY_CARE_PROVIDER_SITE_OTHER): Payer: Managed Care, Other (non HMO) | Admitting: Medical

## 2013-10-11 VITALS — BP 116/79 | HR 79 | Temp 97.6°F | Ht 61.0 in | Wt 229.4 lb

## 2013-10-11 DIAGNOSIS — F32A Depression, unspecified: Secondary | ICD-10-CM | POA: Insufficient documentation

## 2013-10-11 DIAGNOSIS — F3289 Other specified depressive episodes: Secondary | ICD-10-CM

## 2013-10-11 DIAGNOSIS — F329 Major depressive disorder, single episode, unspecified: Secondary | ICD-10-CM

## 2013-10-11 HISTORY — DX: Depression, unspecified: F32.A

## 2013-10-11 NOTE — Patient Instructions (Signed)

## 2013-10-11 NOTE — Progress Notes (Signed)
Patient ID: Carol Solianarlene M [name], female   DOB: 11/29/1990, 23 y.o.   MRN: [id number] Subjective:     Carol [name] is a 23 y.o. [phone]  female who presents for a postpartum visit. She is 16 weeks postpartum following a spontaneous vaginal delivery. I have fully reviewed the prenatal and intrapartum course. The delivery was at 41.4 gestational weeks. Outcome: spontaneous vaginal delivery. Anesthesia: epidural. Postpartum course has been normal until ~ 2 months ago. Patient has been feeling very emotional. She cries a lot and is very anxious. She feels that she gets angry very easily. Unable to sleep though the night. Baby's course has been normal. Baby is feeding by bottle - Parent's choice. Bleeding patient has resumed periods and is bleeding today. Bowel function is normal. Bladder function is normal. Patient is sexually active. Contraception method is condoms. Postpartum depression screening: positive. Patient denies SI or HI at this time. 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 Her husband works long hours and is often not home. The patient has little interaction with anyone outside her family. She also endorses some financial concerns.   The following portions of the patient's history were reviewed and updated as appropriate: allergies, current medications, past family history, past medical history, past social history, past surgical history and problem list.  Review of Systems Pertinent items are noted in HPI.   Objective:    BP 116/79  Pulse 79  Temp(Src) 97.6 F (36.4 C) (Oral)  Ht 5\' 1"  (1.549 m)  Wt 229 lb 6.4 oz (104.055 kg)  BMI 43.37 kg/m2  LMP 10/08/2013  Breastfeeding? No  General:  alert and cooperative   Breasts:  not performed  Lungs: normal effort  Heart:  regular rate  Abdomen: soft   Vulva:  not evaluated  Vagina: not evaluated  Cervix:  not evaluated  Corpus: not examined  Adnexa:  not evaluated  Rectal Exam: Not performed.        Assessment:    Abnormal postpartum exam. Patient scored 16 on PPD score. She denies SI or HI.  Pap smear not done at today's visit.   Plan:    1. Contraception: none 2. Patient given information about IUD and benefits discussed. Patient will decide about birth control and call clinic to schedule an appointment 3. SW consult ordered and confirmed with Child psychotherapistocial Worker. Patient given list of Psychologists in the area and agreed to call and make an appointment with the MD of her choice.  4. Discussed medication option. Patient prefers to try SW and psychotherapy options first.  5. [facility] symptoms of depression worsen to include SI or HI.  6. Follow up in: 6 months for annual exam or as needed.

## 2013-10-14 NOTE — Progress Notes (Signed)
CSW received call from clinic regarding patient's PPD.  CSW attempted to contact patient, but there was no answer.  CSW left message asking patient to return CSW's call.

## 2013-11-06 ENCOUNTER — Ambulatory Visit: Payer: Managed Care, Other (non HMO) | Admitting: Medical

## 2014-07-28 ENCOUNTER — Encounter: Payer: Self-pay | Admitting: Medical

## 2014-11-09 ENCOUNTER — Encounter (HOSPITAL_COMMUNITY): Payer: Self-pay | Admitting: Emergency Medicine

## 2014-11-09 ENCOUNTER — Emergency Department (HOSPITAL_COMMUNITY)
Admission: EM | Admit: 2014-11-09 | Discharge: 2014-11-09 | Disposition: A | Discharge status: Home / Self Care | Payer: 59 | Source: Home / Self Care | Attending: Emergency Medicine | Admitting: Emergency Medicine

## 2014-11-09 ENCOUNTER — Emergency Department (INDEPENDENT_AMBULATORY_CARE_PROVIDER_SITE_OTHER): Payer: 59

## 2014-11-09 DIAGNOSIS — K529 Noninfective gastroenteritis and colitis, unspecified: Secondary | ICD-10-CM

## 2014-11-09 MED ORDER — ONDANSETRON 4 MG PO TBDP: 4.0000 mg | ORAL_TABLET | Freq: Once | ORAL | Status: DC

## 2014-11-09 MED ORDER — ONDANSETRON 4 MG PO TBDP
4.0000 mg | ORAL_TABLET | Freq: Once | ORAL | Status: AC
  Administered 2014-11-09: 4 mg via ORAL

## 2014-11-09 MED ORDER — ONDANSETRON 4 MG PO TBDP
ORAL_TABLET | ORAL | Status: AC
  Filled 2014-11-09: qty 1

## 2014-11-09 NOTE — ED Notes (Signed)
C/o  3 to 4 days of constipation and then explosive diarrhea.  Symptoms present for the past 2 to 3 weeks.   Pt states had 3 to 4 days of constipation and waking this a.m with explosive diarrhea, which has lasted all day.  Having nausea.  Fatigue.  Ache all over.   Lower left sided cramping/tightness.    Denies fever.   No otc treatment tried.

## 2014-11-09 NOTE — Discharge Instructions (Signed)
You likely had a stomach bug. Use Zofran every 8 hours as needed for nausea. Make sure you are drinking plenty of fluids. You should see improvement over the next few days to a week. If you see blood in the stool, the abdominal pain gets worse, or you have high fevers please go to the emergency room. If things are just not getting better in the next week, please follow-up with gastroenterology.

## 2014-11-09 NOTE — ED Provider Notes (Signed)
CSN: [id number]     Arrival date & time 11/09/14  1429 History   First MD Initiated Contact with Patient 11/09/14 1448     Chief Complaint  Patient presents with  . Abdominal Pain  . Diarrhea   (Consider location/radiation/quality/duration/timing/severity/associated sxs/prior Treatment) HPI  She is a 24 year old woman here for evaluation of constipation and diarrhea.  She states for the last 2-3 weeks she will go 3-4 days without a bowel movement and then have a day of loose to watery stool. Her last bowel movement was Thursday. Since that time, she has developed some left lower abdominal pain as well as nausea and low-grade fever. She a temperature of 100.3 yesterday. No vomiting. No blood in the stool. The diarrhea is described as mucousy and yellow. She states she has a long history of intermittent constipation. 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  She has a history of pyloric stenosis as an infant.  Past Medical History  Diagnosis Date  . Pseudotumor cerebri   . 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 Panic attacks    Past Surgical History  Procedure Laterality Date  . Lumbar puncture    . Pyloromyotomy    . Tonsillectomy    . Addenoidectomy     Family History  Problem Relation Age of Onset  . Asthma Father   . Asthma Sister   . Mental illness Sister   . Diabetes Maternal Grandmother   . Cancer Paternal Grandmother   . Diabetes Paternal Grandfather    History  Substance Use Topics  . Smoking status: Never Smoker   . Smokeless tobacco: Never Used  . Alcohol Use: No   OB History    Gravida Para Term Preterm AB TAB SAB Ectopic Multiple Living   0 0 0 0 0 0 2     Review of Systems  Constitutional: Positive for fever and appetite change. Negative for chills.  Respiratory: Negative.   Cardiovascular: Negative.   Gastrointestinal: Positive for nausea, abdominal pain,  diarrhea and constipation. Negative for vomiting and blood in stool.  Neurological: Positive for headaches.    Allergies  Penicillins  Home Medications   Prior to Admission medications   Medication Sig Start Date End Date Taking? Authorizing Provider  acetaZOLAMIDE (DIAMOX) 125 MG tablet Take 2 tablets (250 mg total) by mouth 2 (two) times daily. 06/26/13   Minta Balsam, MD  butalbital-acetaminophen-caffeine (FIORICET, ESGIC) [phone] MG per tablet Take 1-2 tablets by mouth 2 (two) times daily as needed for headache. 06/24/13   [name], MD  ibuprofen (ADVIL,MOTRIN) 600 MG tablet Take 1 tablet (600 mg total) by mouth every 6 (six) hours. 06/24/13   [name], MD  ondansetron (ZOFRAN-ODT) 4 MG disintegrating tablet Take 1 tablet (4 mg total) by mouth once. 11/09/14   Charm Rings, MD  oxyCODONE-acetaminophen (PERCOCET/ROXICET) 5-325 MG per tablet Take 1-2 tablets by mouth every 4 (four) hours as needed. 06/26/13   Tawnya Crook, CNM  Prenatal Vit-Fe Fumarate-FA (PRENATAL VITAMINS PLUS) 27-1 MG TABS Take 1 tablet by mouth daily.    Historical Provider, MD   BP 129/81 mmHg  Pulse 68  Temp(Src) 98.7 F (37.1 C) (Oral)  Resp 16  SpO2 100%  LMP 11/03/2014  Breastfeeding? No Physical Exam  Constitutional: She is oriented to person, place, and time. She appears well-developed and well-nourished. No distress.  Cardiovascular: Normal rate, regular rhythm and normal heart sounds.   No murmur heard. Pulmonary/Chest: Effort normal.  Abdominal: Soft. Bowel sounds are normal. She exhibits no distension and no mass. There is tenderness (primarily left lower quadrant. Some mild tenderness in the epigastric region.). There is no rebound and no guarding.  Neurological: She is alert and oriented to person, place, and time.    ED Course  Procedures (including critical care time) Labs Review Labs Reviewed - No data to display  Imaging Review Dg Abd Acute W/chest  11/09/2014    CLINICAL DATA:  Subacute abdominal pain, constipation and diarrhea. Initial encounter.  EXAM: ACUTE ABDOMEN SERIES (ABDOMEN 2 VIEW & CHEST 1 VIEW)  COMPARISON:  None.  FINDINGS: The cardiomediastinal silhouette is unremarkable.  The lungs are clear.  There is no evidence of pleural effusion or pneumothorax.  The bowel gas pattern is unremarkable.  There is no evidence of bowel obstruction or pneumoperitoneum.  No suspicious calcifications are identified.  The bony structures are unremarkable.  IMPRESSION: Negative abdominal radiographs.  No acute cardiopulmonary disease.   Electronically Signed   By: Harmon PierJeffrey  Hu M.D.   On: 11/09/2014 16:22     MDM   1. Gastroenteritis    Zofran 4 mg ODT given.  No evidence for obstruction on abdominal films. No excessive stool burden. This is likely a gastroenteritis. Zofran as needed for nausea. Reviewed reasons to go to the ER as in after visit summary. If no improvement in 1 week, follow-up with gastroenterology.    Charm RingsErin J Sourish Allender, MD 11/09/14 [phone]

## 2015-06-29 ENCOUNTER — Telehealth: Payer: Self-pay

## 2015-06-29 NOTE — Telephone Encounter (Signed)
Patient returned call and I explained to her that we don't do BP checks under nurses, you have to have an appointment. Patient was very understanding and asked if she could drop off some paperwork for Dr. Sherie Don about what her numbers have been running and I told her yes. Patient explained she couldn't come in any other time for an appointment this week.

## 2015-10-09 ENCOUNTER — Emergency Department (HOSPITAL_COMMUNITY): Payer: Medicaid Other

## 2015-10-09 ENCOUNTER — Emergency Department (HOSPITAL_COMMUNITY)
Admission: EM | Admit: 2015-10-09 | Discharge: 2015-10-09 | Disposition: A | Discharge status: Home / Self Care | Payer: Medicaid Other | Attending: Emergency Medicine | Admitting: Emergency Medicine

## 2015-10-09 ENCOUNTER — Encounter (HOSPITAL_COMMUNITY): Payer: Self-pay | Admitting: Emergency Medicine

## 2015-10-09 DIAGNOSIS — J029 Acute pharyngitis, unspecified: Secondary | ICD-10-CM | POA: Insufficient documentation

## 2015-10-09 DIAGNOSIS — R197 Diarrhea, unspecified: Secondary | ICD-10-CM | POA: Diagnosis not present

## 2015-10-09 DIAGNOSIS — R509 Fever, unspecified: Secondary | ICD-10-CM | POA: Diagnosis not present

## 2015-10-09 DIAGNOSIS — R079 Chest pain, unspecified: Secondary | ICD-10-CM | POA: Insufficient documentation

## 2015-10-09 DIAGNOSIS — Z8619 Personal history of other infectious and parasitic diseases: Secondary | ICD-10-CM | POA: Diagnosis not present

## 2015-10-09 DIAGNOSIS — Z79899 Other long term (current) drug therapy: Secondary | ICD-10-CM | POA: Insufficient documentation

## 2015-10-09 DIAGNOSIS — Z8659 Personal history of other mental and behavioral disorders: Secondary | ICD-10-CM | POA: Insufficient documentation

## 2015-10-09 DIAGNOSIS — Z3202 Encounter for pregnancy test, result negative: Secondary | ICD-10-CM | POA: Insufficient documentation

## 2015-10-09 DIAGNOSIS — R591 Generalized enlarged lymph nodes: Secondary | ICD-10-CM | POA: Diagnosis not present

## 2015-10-09 DIAGNOSIS — Z8669 Personal history of other diseases of the nervous system and sense organs: Secondary | ICD-10-CM | POA: Insufficient documentation

## 2015-10-09 DIAGNOSIS — R51 Headache: Secondary | ICD-10-CM | POA: Insufficient documentation

## 2015-10-09 DIAGNOSIS — R0602 Shortness of breath: Secondary | ICD-10-CM | POA: Diagnosis not present

## 2015-10-09 DIAGNOSIS — R110 Nausea: Secondary | ICD-10-CM | POA: Insufficient documentation

## 2015-10-09 DIAGNOSIS — Z880 Allergy status to penicillin: Secondary | ICD-10-CM | POA: Insufficient documentation

## 2015-10-09 DIAGNOSIS — R519 Headache, unspecified: Secondary | ICD-10-CM

## 2015-10-09 DIAGNOSIS — R0789 Other chest pain: Secondary | ICD-10-CM | POA: Diagnosis not present

## 2015-10-09 LAB — CBC WITH DIFFERENTIAL/PLATELET
Basophils Absolute: 0 10*3/uL (ref 0.0–0.1)
Basophils Relative: 0 %
EOS ABS: 0.1 10*3/uL (ref 0.0–0.7)
Eosinophils Relative: 2 %
HCT: 40.1 % (ref 36.0–46.0)
HEMOGLOBIN: 13.2 g/dL (ref 12.0–15.0)
LYMPHS ABS: 2.1 10*3/uL (ref 0.7–4.0)
LYMPHS PCT: 37 %
MCH: 28.3 pg (ref 26.0–34.0)
MCHC: 32.9 g/dL (ref 30.0–36.0)
MCV: 86.1 fL (ref 78.0–100.0)
Monocytes Absolute: 0.7 10*3/uL (ref 0.1–1.0)
Monocytes Relative: 11 %
NEUTROS PCT: 50 %
Neutro Abs: 2.8 10*3/uL (ref 1.7–7.7)
Platelets: 193 10*3/uL (ref 150–400)
RBC: 4.66 MIL/uL (ref 3.87–5.11)
RDW: 12.9 % (ref 11.5–15.5)
WBC: 5.7 10*3/uL (ref 4.0–10.5)

## 2015-10-09 LAB — COMPREHENSIVE METABOLIC PANEL
ALT: 41 U/L (ref 14–54)
AST: 32 U/L (ref 15–41)
Albumin: 4 g/dL (ref 3.5–5.0)
Alkaline Phosphatase: 53 U/L (ref 38–126)
Anion gap: 8 (ref 5–15)
BUN: 15 mg/dL (ref 6–20)
CO2: 27 mmol/L (ref 22–32)
CREATININE: 0.52 mg/dL (ref 0.44–1.00)
Calcium: 9.1 mg/dL (ref 8.9–10.3)
Chloride: 104 mmol/L (ref 101–111)
GFR calc non Af Amer: >60 mL/min (ref >60)
Glucose, Bld: 101 mg/dL — ABNORMAL HIGH (ref 65–99)
Potassium: 3.5 mmol/L (ref 3.5–5.1)
SODIUM: 139 mmol/L (ref 135–145)
Total Bilirubin: 0.3 mg/dL (ref 0.3–1.2)
Total Protein: 7.5 g/dL (ref 6.5–8.1)

## 2015-10-09 LAB — URINALYSIS, ROUTINE W REFLEX MICROSCOPIC
BILIRUBIN URINE: NEGATIVE
GLUCOSE, UA: NEGATIVE mg/dL
Hgb urine dipstick: NEGATIVE
Ketones, ur: NEGATIVE mg/dL
Leukocytes, UA: NEGATIVE
Nitrite: NEGATIVE
PH: 6 (ref 5.0–8.0)
Protein, ur: NEGATIVE mg/dL
Specific Gravity, Urine: 1.01 (ref 1.005–1.030)

## 2015-10-09 LAB — PREGNANCY, URINE: Preg Test, Ur: NEGATIVE

## 2015-10-09 MED ORDER — IBUPROFEN 800 MG PO TABS: 800.0000 mg | ORAL_TABLET | Freq: Three times a day (TID) | ORAL | Status: DC | PRN

## 2015-10-09 MED ORDER — LOPERAMIDE HCL 2 MG PO CAPS: 2.0000 mg | ORAL_CAPSULE | Freq: Four times a day (QID) | ORAL | Status: DC | PRN

## 2015-10-09 MED ORDER — ONDANSETRON 4 MG PO TBDP: 4.0000 mg | ORAL_TABLET | Freq: Three times a day (TID) | ORAL | Status: DC | PRN

## 2015-10-09 MED ORDER — IBUPROFEN 800 MG PO TABS
800.0000 mg | ORAL_TABLET | Freq: Once | ORAL | Status: AC
  Administered 2015-10-09: 800 mg via ORAL
  Filled 2015-10-09: qty 1

## 2015-10-09 MED ORDER — ONDANSETRON 4 MG PO TBDP
4.0000 mg | ORAL_TABLET | Freq: Once | ORAL | Status: AC
  Administered 2015-10-09: 4 mg via ORAL
  Filled 2015-10-09: qty 1

## 2015-10-09 MED ORDER — LOPERAMIDE HCL 2 MG PO CAPS
4.0000 mg | ORAL_CAPSULE | Freq: Once | ORAL | Status: AC
  Administered 2015-10-09: 4 mg via ORAL
  Filled 2015-10-09: qty 2

## 2015-10-09 NOTE — ED Notes (Signed)
Pt states she has had a headache for a week and woke up with chest pain yesterday. Pt denies weakness, SOB, dizziness. Pt states she is N/ and has D/. Pt also states she has a "lump" in her left side of her throat.

## 2015-10-09 NOTE — Discharge Instructions (Signed)
I suspect that your symptoms are secondary to a viral illness. I recommend you rest, increase your fluid intake.  You may alternate Tylenol 1000 mg every 6 hours as needed for fever or pain with ibuprofen 800 mg every 8 hours as needed for fever and pain.   Diarrhea Diarrhea is frequent loose and watery bowel movements. It can cause you to feel weak and dehydrated. Dehydration can cause you to become tired and thirsty, have a dry mouth, and have decreased urination that often is dark yellow. Diarrhea is a sign of another problem, most often an infection that will not last long. In most cases, diarrhea typically lasts 2-3 days. However, it can last longer if it is a sign of something more serious. It is important to treat your diarrhea as directed by your caregiver to lessen or prevent future episodes of diarrhea. CAUSES  Some common causes include:  Gastrointestinal infections caused by viruses, bacteria, or parasites.  Food poisoning or food allergies.  Certain medicines, such as antibiotics, chemotherapy, and laxatives.  Artificial sweeteners and fructose.  Digestive disorders. HOME CARE INSTRUCTIONS  Ensure adequate fluid intake (hydration): Have 1 cup (8 oz) of fluid for each diarrhea episode. Avoid fluids that contain simple sugars or sports drinks, fruit juices, whole milk products, and sodas. Your urine should be clear or pale yellow if you are drinking enough fluids. Hydrate with an oral rehydration solution that you can purchase at pharmacies, retail stores, and online. You can prepare an oral rehydration solution at home by mixing the following ingredients together:   - tsp table salt.   tsp baking soda.   tsp salt substitute containing potassium chloride.  1  tablespoons sugar.  1 L (34 oz) of water.  Certain foods and beverages may increase the speed at which food moves through the gastrointestinal (GI) tract. These foods and beverages should be avoided and  include:  Caffeinated and alcoholic beverages.  High-fiber foods, such as raw fruits and vegetables, nuts, seeds, and whole grain breads and cereals.  Foods and beverages sweetened with sugar alcohols, such as xylitol, sorbitol, and mannitol.  Some foods may be well tolerated and may help thicken stool including:  Starchy foods, such as rice, toast, pasta, low-sugar cereal, oatmeal, grits, baked potatoes, crackers, and bagels.  Bananas.  Applesauce.  Add probiotic-rich foods to help increase healthy bacteria in the GI tract, such as yogurt and fermented milk products.  Wash your hands well after each diarrhea episode.  Only take over-the-counter or prescription medicines as directed by your caregiver.  Take a warm bath to relieve any burning or pain from frequent diarrhea episodes. SEEK IMMEDIATE MEDICAL CARE IF:   You are unable to keep fluids down.  You have persistent vomiting.  You have blood in your stool, or your stools are black and tarry.  You do not urinate in 6-8 hours, or there is only a small amount of very dark urine.  You have abdominal pain that increases or localizes.  You have weakness, dizziness, confusion, or light-headedness.  You have a severe headache.  Your diarrhea gets worse or does not get better.  You have a fever or persistent symptoms for more than 2-3 days.  You have a fever and your symptoms suddenly get worse. MAKE SURE YOU:   Understand these instructions.  Will watch your condition.  Will get help right away if you are not doing well or get worse.   This information is not intended to replace advice given  to you by your health care provider. Make sure you discuss any questions you have with your health care provider.   Document Released: 09/02/2002 Document Revised: 10/03/2014 Document Reviewed: 05/20/2012 Elsevier Interactive Patient Education 2016 [employer]  General Headache Without Cause A headache is pain or  discomfort felt around the head or neck area. The specific cause of a headache may not be found. There are many causes and types of headaches. A few common ones are:  Tension headaches.  Migraine headaches.  Cluster headaches.  Chronic daily headaches. HOME CARE INSTRUCTIONS  Watch your condition for any changes. Take these steps to help with your condition: Managing Pain  Take over-the-counter and prescription medicines only as told by your health care provider.  Lie down in a dark, quiet room when you have a headache.  If directed, apply ice to the head and neck area:  Put ice in a plastic bag.  Place a towel between your skin and the bag.  Leave the ice on for 20 minutes, 2-3 times per day.  Use a heating pad or hot shower to apply heat to the head and neck area as told by your health care provider.  Keep lights dim if bright lights bother you or make your headaches worse. Eating and Drinking  Eat meals on a regular schedule.  Limit alcohol use.  Decrease the amount of caffeine you drink, or stop drinking caffeine. General Instructions  Keep all follow-up visits as told by your health care provider. This is important.  Keep a headache journal to help find out what may trigger your headaches. For example, write down:  What you eat and drink.  How much sleep you get.  Any change to your diet or medicines.  Try massage or other relaxation techniques.  Limit stress.  Sit up straight, and do not tense your muscles.  Do not use tobacco products, including cigarettes, chewing tobacco, or e-cigarettes. If you need help quitting, ask your health care provider.  Exercise regularly as told by your health care provider.  Sleep on a regular schedule. Get 7-9 hours of sleep, or the amount recommended by your health care provider. SEEK MEDICAL CARE IF:   Your symptoms are not helped by medicine.  You have a headache that is different from the usual headache.  You  have nausea or you vomit.  You have a fever. SEEK IMMEDIATE MEDICAL CARE IF:   Your headache becomes severe.  You have repeated vomiting.  You have a stiff neck.  You have a loss of vision.  You have problems with speech.  You have pain in the eye or ear.  You have muscular weakness or loss of muscle control.  You lose your balance or have trouble walking.  You feel faint or pass out.  You have confusion.   This information is not intended to replace advice given to you by your health care provider. Make sure you discuss any questions you have with your health care provider.   Document Released: 09/12/2005 Document Revised: 06/03/2015 Document Reviewed: 01/05/2015 Elsevier Interactive Patient Education 2016 [employer]  Nonspecific Chest Pain  Chest pain can be caused by many different conditions. There is always a chance that your pain could be related to something serious, such as a heart attack or a blood clot in your lungs. Chest pain can also be caused by conditions that are not life-threatening. If you have chest pain, it is very important to follow up with your health care  provider. CAUSES  Chest pain can be caused by:  Heartburn.  Pneumonia or bronchitis.  Anxiety or stress.  Inflammation around your heart (pericarditis) or lung (pleuritis or pleurisy).  A blood clot in your lung.  A collapsed lung (pneumothorax). It can develop suddenly on its own (spontaneous pneumothorax) or from trauma to the chest.  Shingles infection (varicella-zoster virus).  Heart attack.  Damage to the bones, muscles, and cartilage that make up your chest wall. This can include:  Bruised bones due to injury.  Strained muscles or cartilage due to frequent or repeated coughing or overwork.  Fracture to one or more ribs.  Sore cartilage due to inflammation (costochondritis). RISK FACTORS  Risk factors for chest pain may include:  Activities that increase your risk for  trauma or injury to your chest.  Respiratory infections or conditions that cause frequent coughing.  Medical conditions or overeating that can cause heartburn.  Heart disease or family history of heart disease.  Conditions or health behaviors that increase your risk of developing a blood clot.  Having had chicken pox (varicella zoster). SIGNS AND SYMPTOMS Chest pain can feel like:  Burning or tingling on the surface of your chest or deep in your chest.  Crushing, pressure, aching, or squeezing pain.  Dull or sharp pain that is worse when you move, cough, or take a deep breath.  Pain that is also felt in your back, neck, shoulder, or arm, or pain that spreads to any of these areas. Your chest pain may come and go, or it may stay constant. DIAGNOSIS Lab tests or other studies may be needed to find the cause of your pain. Your health care provider may have you take a test called an ambulatory ECG (electrocardiogram). An ECG records your heartbeat patterns at the time the test is performed. You may also have other tests, such as:  Transthoracic echocardiogram (TTE). During echocardiography, sound waves are used to create a picture of all of the heart structures and to look at how blood flows through your heart.  Transesophageal echocardiogram (TEE).This is a more advanced imaging test that obtains images from inside your body. It allows your health care provider to see your heart in finer detail.  Cardiac monitoring. This allows your health care provider to monitor your heart rate and rhythm in real time.  Holter monitor. This is a portable device that records your heartbeat and can help to diagnose abnormal heartbeats. It allows your health care provider to track your heart activity for several days, if needed.  Stress tests. These can be done through exercise or by taking medicine that makes your heart beat more quickly.  Blood tests.  Imaging tests. TREATMENT  Your treatment  depends on what is causing your chest pain. Treatment may include:  Medicines. These may include:  Acid blockers for heartburn.  Anti-inflammatory medicine.  Pain medicine for inflammatory conditions.  Antibiotic medicine, if an infection is present.  Medicines to dissolve blood clots.  Medicines to treat coronary artery disease.  Supportive care for conditions that do not require medicines. This may include:  Resting.  Applying heat or cold packs to injured areas.  Limiting activities until pain decreases. HOME CARE INSTRUCTIONS  If you were prescribed an antibiotic medicine, finish it all even if you start to feel better.  Avoid any activities that bring on chest pain.  Do not use any tobacco products, including cigarettes, chewing tobacco, or electronic cigarettes. If you need help quitting, ask your health care provider.  Do not drink alcohol.  Take medicines only as directed by your health care provider.  Keep all follow-up visits as directed by your health care provider. This is important. This includes any further testing if your chest pain does not go away.  If heartburn is the cause for your chest pain, you may be told to keep your head raised (elevated) while sleeping. This reduces the chance that acid will go from your stomach into your esophagus.  Make lifestyle changes as directed by your health care provider. These may include:  Getting regular exercise. Ask your health care provider to suggest some activities that are safe for you.  Eating a heart-healthy diet. A registered dietitian can help you to learn healthy eating options.  Maintaining a healthy weight.  Managing diabetes, if necessary.  Reducing stress. SEEK MEDICAL CARE IF:  Your chest pain does not go away after treatment.  You have a rash with blisters on your chest.  You have a fever. SEEK IMMEDIATE MEDICAL CARE IF:   Your chest pain is worse.  You have an increasing cough, or you  cough up blood.  You have severe abdominal pain.  You have severe weakness.  You faint.  You have chills.  You have sudden, unexplained chest discomfort.  You have sudden, unexplained discomfort in your arms, back, neck, or jaw.  You have shortness of breath at any time.  You suddenly start to sweat, or your skin gets clammy.  You feel nauseous or you vomit.  You suddenly feel light-headed or dizzy.  Your heart begins to beat quickly, or it feels like it is skipping beats. These symptoms may represent a serious problem that is an emergency. Do not wait to see if the symptoms will go away. Get medical help right away. Call your local emergency services (911 in the [place] Do not drive yourself to the hospital.   This information is not intended to replace advice given to you by your health care provider. Make sure you discuss any questions you have with your health care provider.   Document Released: 06/22/2005 Document Revised: 10/03/2014 Document Reviewed: 04/18/2014 Elsevier Interactive Patient Education 2016 [employer] Inc[employer]  Lymphadenopathy Lymphadenopathy refers to swollen or enlarged lymph glands, also called lymph nodes. Lymph glands are part of your body's defense (immune) system, which protects the body from infections, germs, and diseases. Lymph glands are found in many locations in your body, including the neck, underarm, and groin.  Many things can cause lymph glands to become enlarged. When your immune system responds to germs, such as viruses or bacteria, infection-fighting cells and fluid build up. This causes the glands to grow in size. Usually, this is not something to worry about. The swelling and any soreness often go away without treatment. However, swollen lymph glands can also be caused by a number of diseases. Your health care provider may do various tests to help determine the cause. If the cause of your swollen lymph glands cannot be found, it is important to  monitor your condition to make sure the swelling goes away. HOME CARE INSTRUCTIONS Watch your condition for any changes. The following actions may help to lessen any discomfort you are feeling:  Get plenty of rest.  Take medicines only as directed by your health care provider. Your health care provider may recommend over-the-counter medicines for pain.  Apply moist heat compresses to the site of swollen lymph nodes as directed by your health care provider. This can help reduce any pain.  Check your lymph nodes daily for any changes.  Keep all follow-up visits as directed by your health care provider. This is important. SEEK MEDICAL CARE IF:  Your lymph nodes are still swollen after 2 weeks.  Your swelling increases or spreads to other areas.  Your lymph nodes are hard, seem fixed to the skin, or are growing rapidly.  Your skin over the lymph nodes is red and inflamed.  You have a fever.  You have chills.  You have fatigue.  You develop a sore throat.  You have abdominal pain.  You have weight loss.  You have night sweats. SEEK IMMEDIATE MEDICAL CARE IF:  You notice fluid leaking from the area of the enlarged lymph node.  You have severe pain in any area of your body.  You have chest pain.  You have shortness of breath.   This information is not intended to replace advice given to you by your health care provider. Make sure you discuss any questions you have with your health care provider.   Document Released: 06/21/2008 Document Revised: 10/03/2014 Document Reviewed: 04/17/2014 Elsevier Interactive Patient Education [employer]

## 2015-10-09 NOTE — ED Provider Notes (Addendum)
TIME SEEN: 4:10 AM  CHIEF COMPLAINT: Chills, chest pain, nausea, diarrhea, headache, "lump" on the side of her throat  HPI: Pt is a 25 y.o. female with history of pseudotumor cerebri, anxiety who presents to the emergency department with one week of intermittent diffuse throbbing headaches that improved with over-the-counter medications, chills, body aches, nausea, diarrhea. Also reports she has had mild sore throat. No runny nose or cough. States tonight she was concerned because she felt a lump on the left side of her neck which was tender. She also reports that since yesterday she is having episodes of left-sided chest pain that feels like someone is "pushing on a bruise" and felt she couldn't catch her breath. She is also had palpitations with standing. No vomiting. No changes in her vision. States these headaches do not feel similar to her pseudotumor cerebri. She is no longer on medication for her pseudotumor cerebri. States her last lumbar puncture was in 2008. No neck pain or neck stiffness. No rash. No sick contacts or recent travel.   No history of PE, DVT, exogenous estrogen use, fracture, surgery, trauma, hospitalization, prolonged travel. No lower extremity swelling or pain. No calf tenderness.   ROS: See HPI Constitutional: no fever  Eyes: no drainage  ENT: no runny nose   Cardiovascular:   chest pain  Resp:  SOB  GI: no vomiting GU: no dysuria or hematuria Integumentary: no rash  Allergy: no hives  Musculoskeletal: no leg swelling  Neurological: no slurred speech ROS otherwise negative  PAST MEDICAL HISTORY/PAST SURGICAL HISTORY:  Past Medical History  Diagnosis Date  . Pseudotumor cerebri   . 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 Panic attacks     MEDICATIONS:  Prior to Admission medications   Medication Sig Start Date End Date Taking? Authorizing Provider  acetaZOLAMIDE (DIAMOX) 125 MG tablet Take 2  tablets (250 mg total) by mouth 2 (two) times daily. 06/26/13   Minta BalsamMichael R Odom, MD  butalbital-acetaminophen-caffeine (FIORICET, ESGIC) [phone] MG per tablet Take 1-2 tablets by mouth 2 (two) times daily as needed for headache. 06/24/13   Narda Bondsalph A Nettey, MD  ibuprofen (ADVIL,MOTRIN) 600 MG tablet Take 1 tablet (600 mg total) by mouth every 6 (six) hours. 06/24/13   Narda Bondsalph A Nettey, MD  ondansetron (ZOFRAN-ODT) 4 MG disintegrating tablet Take 1 tablet (4 mg total) by mouth once. 11/09/14   Charm RingsErin J Honig, MD  oxyCODONE-acetaminophen (PERCOCET/ROXICET) 5-325 MG per tablet Take 1-2 tablets by mouth every 4 (four) hours as needed. 06/26/13   Armando ReichertHeather D Hogan, CNM  Prenatal Vit-Fe Fumarate-FA (PRENATAL VITAMINS PLUS) 27-1 MG TABS Take 1 tablet by mouth daily.    Historical Provider, MD    ALLERGIES:  Allergies  Allergen Reactions  . Penicillins Anaphylaxis    SOCIAL HISTORY:  Social History  Substance Use Topics  . Smoking status: Never Smoker   . Smokeless tobacco: Never Used  . Alcohol Use: No    FAMILY HISTORY: Family History  Problem Relation Age of Onset  . Asthma Father   . Asthma Sister   . Mental illness Sister   . Diabetes Maternal Grandmother   . Cancer Paternal Grandmother   . Diabetes Paternal Grandfather     EXAM: BP 112/77 mmHg  Pulse 86  Temp(Src) 98.4 F (36.9 C) (Oral)  Resp 23  Ht 4\' 11"  (1.499 m)  Wt 210 lb (95.255 kg)  BMI 42.39 kg/m2  SpO2 100%  LMP 10/06/2015 CONSTITUTIONAL: Alert and oriented and responds appropriately to questions. Well-appearing; well-nourished HEAD: Normocephalic EYES: Conjunctivae clear, PERRL, extraocular is intact ENT: normal nose; no rhinorrhea; moist mucous membranes; pharynx without lesions noted; no tonsillar hypertrophy or exudate, no uvular deviation, no trismus or drooling, normal phonation, no stridor, no dental caries or abscess noted, no dental pain with palpation with tongue depressor, no Ludwig's angina, tongue sits flat in  the bottom of the mouth NECK: Supple, no meningismus, bilateral anterior cervical lymphadenopathy, no other mass noted in her neck, no Ludwig's angina CARD: RRR; S1 and S2 appreciated; no murmurs, no clicks, no rubs, no gallops RESP: Normal chest excursion without splinting or tachypnea; breath sounds clear and equal bilaterally; no wheezes, no rhonchi, no rales, no hypoxia or respiratory distress, speaking full sentences ABD/GI: Normal bowel sounds; non-distended; soft, non-tender, no rebound, no guarding, no peritoneal signs BACK:  The back appears normal and is non-tender to palpation, there is no CVA tenderness EXT: Normal ROM in all joints; non-tender to palpation; no edema; normal capillary refill; no cyanosis, no calf tenderness or swelling    SKIN: Normal color for age and race; warm, no rash NEURO: Moves all extremities equally, sensation to light touch intact diffusely, cranial nerves II through XII intact, normal gait PSYCH: The patient's mood and manner are appropriate. Grooming and personal hygiene are appropriate.  MEDICAL DECISION MAKING: Patient here with multiple symptoms. I suspect that this is all secondary to a viral illness. She has not had an influenza vaccination. She is afebrile and hemodynamically stable. She does describe diffuse throbbing headache but has no meningismus, nuchal rigidity on exam. States these headaches are different than her pseudotumor cerebri and she is neurologically intact without vision changes. Abdominal exam is benign. No risk factors for ACS, pulmonary embolus. Doubt dissection. Will obtain basic labs, urine, chest x-ray. EKG shows no ischemic changes, arrhythmia or interval change. I have offered her IV fluids and IV medications for symptom control and she declined stating that she is "scared of needles". We'll give her oral ibuprofen, Zofran, Imodium and encourage oral fluids.  ED PROGRESS: Patient reports feeling much better. Her labs, urine, chest  x-ray have been completely unremarkable today. I suspect that she may have a viral illness even possible influenza. She is outside treatment window for Tamiflu. I do not think she is having increased intracranial pressure as her headache is almost completely gone after ibuprofen. Have discussed with her she feels that she is having a headache similar to her prior pseudotumor to return to the emergency department she may need a lumbar puncture. She agrees with this plan. She states she is no longer on Diamox. She only sees her neurologist now as needed. We'll discharge with instructions to alternate Tylenol and ibuprofen for pain, Imodium as needed for diarrhea. We'll discharge with Zofran for nausea. Discussed return precautions. She verbalized understanding and is comfortable with this plan.     EKG Interpretation  Date/Time:  Friday October 09 2015 03:59:24 EST Ventricular Rate:  83 PR Interval:  134 QRS Duration: 89 QT Interval:  371 QTC Calculation: 436 R Axis:   36 Text Interpretation:  Sinus rhythm No significant change since last tracing Confirmed by WARD,  DO, KRISTEN [phone]) on 10/09/2015 4:07:57 AM          Layla Maw Ward, DO 10/09/15 [id number]  Layla Maw Ward, DO 10/09/15 [id number]

## 2015-10-10 ENCOUNTER — Emergency Department (HOSPITAL_COMMUNITY)
Admission: EM | Admit: 2015-10-10 | Discharge: 2015-10-10 | Disposition: A | Discharge status: Home / Self Care | Payer: Medicaid Other | Attending: Emergency Medicine | Admitting: Emergency Medicine

## 2015-10-10 ENCOUNTER — Encounter (HOSPITAL_COMMUNITY): Payer: Self-pay | Admitting: Emergency Medicine

## 2015-10-10 ENCOUNTER — Emergency Department (HOSPITAL_COMMUNITY): Payer: Medicaid Other

## 2015-10-10 DIAGNOSIS — Z8619 Personal history of other infectious and parasitic diseases: Secondary | ICD-10-CM | POA: Insufficient documentation

## 2015-10-10 DIAGNOSIS — Z880 Allergy status to penicillin: Secondary | ICD-10-CM | POA: Insufficient documentation

## 2015-10-10 DIAGNOSIS — R531 Weakness: Secondary | ICD-10-CM | POA: Diagnosis not present

## 2015-10-10 DIAGNOSIS — H538 Other visual disturbances: Secondary | ICD-10-CM | POA: Insufficient documentation

## 2015-10-10 DIAGNOSIS — Z8659 Personal history of other mental and behavioral disorders: Secondary | ICD-10-CM | POA: Diagnosis not present

## 2015-10-10 DIAGNOSIS — R519 Headache, unspecified: Secondary | ICD-10-CM

## 2015-10-10 DIAGNOSIS — R51 Headache: Secondary | ICD-10-CM | POA: Diagnosis not present

## 2015-10-10 DIAGNOSIS — R200 Anesthesia of skin: Secondary | ICD-10-CM | POA: Insufficient documentation

## 2015-10-10 MED ORDER — SODIUM CHLORIDE 0.9 % IV BOLUS (SEPSIS)
1000.0000 mL | Freq: Once | INTRAVENOUS | Status: AC
  Administered 2015-10-10: 1000 mL via INTRAVENOUS

## 2015-10-10 MED ORDER — OXYCODONE-ACETAMINOPHEN 5-325 MG PO TABS: 1.0000 | ORAL_TABLET | ORAL | Status: DC | PRN

## 2015-10-10 MED ORDER — METOCLOPRAMIDE HCL 5 MG/ML IJ SOLN
10.0000 mg | Freq: Once | INTRAMUSCULAR | Status: AC
  Administered 2015-10-10: 10 mg via INTRAVENOUS
  Filled 2015-10-10: qty 2

## 2015-10-10 MED ORDER — KETOROLAC TROMETHAMINE 30 MG/ML IJ SOLN
30.0000 mg | Freq: Once | INTRAMUSCULAR | Status: AC
  Administered 2015-10-10: 30 mg via INTRAVENOUS
  Filled 2015-10-10: qty 1

## 2015-10-10 MED ORDER — DIPHENHYDRAMINE HCL 50 MG/ML IJ SOLN
25.0000 mg | Freq: Once | INTRAMUSCULAR | Status: AC
  Administered 2015-10-10: 25 mg via INTRAVENOUS
  Filled 2015-10-10: qty 1

## 2015-10-10 MED ORDER — PROMETHAZINE HCL 25 MG PO TABS: 25.0000 mg | ORAL_TABLET | Freq: Four times a day (QID) | ORAL | Status: DC | PRN

## 2015-10-10 NOTE — ED Provider Notes (Signed)
CSN: [id number]     Arrival date & time 10/10/15  1029 History  By signing my name below, I, Jarvis Morganaylor Ferguson, attest that this documentation has been prepared under the direction and in the presence of Donnetta HutchingBrian Ranson Belluomini, MD. Electronically Signed: Jarvis Morganaylor Ferguson, ED Scribe. 10/10/2015. 1:30 PM.    Chief Complaint  Patient presents with  . Headache  . Numbness   The history is provided by the patient. No language interpreter was used.    HPI Comments: Francella SolianDarlene M Baker is a 25 y.o. female with a h/o pseudotumor cerebri who presents to the Emergency Department complaining of gradual onset, constant, frontal HA for 2 weeks that began to gradually worsening around 6 hours ago. She reports that she began to have associated intermittent left sided facial numbness/tingling left hand numbness that began around 3 hours ago at home. She also endorses associated blurred vision and weakness. Pt also notes her gums were bleeding last night but that has now resolved. Pt reports she attempted to rest with no significant relief for her symptoms. She has not taken any medications prior to arrival. Pt was in ER yesterday for similar HA. She states she was diagnosed with pseudotumor cerebri in 2009. She denies any other associated symptoms at this time.   Past Medical History  Diagnosis Date  . Pseudotumor cerebri   . 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 Panic attacks    Past Surgical History  Procedure Laterality Date  . Lumbar puncture    . Pyloromyotomy    . Tonsillectomy    . Addenoidectomy     Family History  Problem Relation Age of Onset  . Asthma Father   . Asthma Sister   . Mental illness Sister   . Diabetes Maternal Grandmother   . Cancer Paternal Grandmother   . Diabetes Paternal Grandfather    Social History  Substance Use Topics  . Smoking status: Never Smoker   . Smokeless tobacco: Never Used  . Alcohol Use: No   OB History     Gravida Para Term Preterm AB TAB SAB Ectopic Multiple Living   2 2 2  0 0 0 0 0 0 2     Review of Systems A complete 10 system review of systems was obtained and all systems are negative except as noted in the HPI and PMH.     Allergies  Penicillins  Home Medications   Prior to Admission medications   Medication Sig Start Date End Date Taking? Authorizing Provider  ibuprofen (ADVIL,MOTRIN) 800 MG tablet Take 1 tablet (800 mg total) by mouth every 8 (eight) hours as needed for mild pain. Patient not taking: Reported on 10/10/2015 10/09/15   Layla MawKristen N Ward, DO  loperamide (IMODIUM) 2 MG capsule Take 1 capsule (2 mg total) by mouth 4 (four) times daily as needed for diarrhea or loose stools. Patient not taking: Reported on 10/10/2015 10/09/15   Kristen N Ward, DO  ondansetron (ZOFRAN ODT) 4 MG disintegrating tablet Take 1 tablet (4 mg total) by mouth every 8 (eight) hours as needed for nausea or vomiting. Patient not taking: Reported on 10/10/2015 10/09/15   Layla MawKristen N Ward, DO  oxyCODONE-acetaminophen (PERCOCET/ROXICET) 5-325 MG tablet Take 1-2 tablets by mouth every 4 (four) hours as needed for severe pain. 10/10/15   Donnetta HutchingBrian Candas Deemer, MD  promethazine (PHENERGAN) 25 MG tablet Take 1 tablet (25 mg total) by mouth every 6 (six) hours  as needed. 10/10/15   Donnetta Hutching, MD   Triage Vitals: BP 118/68 mmHg  Pulse 105  Temp(Src) 99.8 F (37.7 C) (Oral)  Resp 16  Ht 4\' 11"  (1.499 m)  Wt 210 lb (95.255 kg)  BMI 42.39 kg/m2  SpO2 98%  LMP 10/06/2015  Physical Exam  Constitutional: She is oriented to person, place, and time. She appears well-developed and well-nourished.  HENT:  Head: Normocephalic and atraumatic.  Eyes: Conjunctivae and EOM are normal. Pupils are equal, round, and reactive to light.  Neck: Normal range of motion. Neck supple.  Cardiovascular: Normal rate and regular rhythm.   Pulmonary/Chest: Effort normal and breath sounds normal.  Abdominal: Soft. Bowel sounds are normal.   Musculoskeletal: Normal range of motion.  Neurological: She is alert and oriented to person, place, and time. She has normal strength. No sensory deficit.  Skin: Skin is warm and dry.  Psychiatric: She has a normal mood and affect. Her behavior is normal.  Nursing note and vitals reviewed.   ED Course  Procedures   DIAGNOSTIC STUDIES: Oxygen Saturation is 98% on RA, normal by my interpretation.    COORDINATION OF CARE:  11:11 AM- Will order head CT, meds and fluids.  Pt advised of plan for treatment and pt agrees.  Labs Review Labs Reviewed - No data to display  Imaging Review Dg Chest 2 View  10/09/2015  CLINICAL DATA:  25 year old female with chest pain EXAM: CHEST  2 VIEW COMPARISON:  Radiograph dated 11/09/2014 FINDINGS: The heart size and mediastinal contours are within normal limits. Both lungs are clear. The visualized skeletal structures are unremarkable. IMPRESSION: No active cardiopulmonary disease. Electronically Signed   By: Elgie Collard M.D.   On: 10/09/2015 05:15   Ct Head Wo Contrast  10/10/2015  CLINICAL DATA:  Acute onset of headache today. Right hand in left facial numbness. Pseudotumor cerebral. EXAM: CT HEAD WITHOUT CONTRAST TECHNIQUE: Contiguous axial images were obtained from the base of the skull through the vertex without intravenous contrast. COMPARISON:  Brain MRI on 06/25/2013. FINDINGS: Brain: No evidence of acute infarction, hemorrhage, extra-axial collection, ventriculomegaly, or mass effect. Vascular: No hyperdense vessel or unexpected calcification. Skull: Negative for fracture or focal lesion. Sinuses/Orbits: No acute findings. Other: None. IMPRESSION: Negative noncontrast head CT. Electronically Signed   By: Myles Rosenthal M.D.   On: 10/10/2015 12:48   I have personally reviewed and evaluated these images and lab results as part of my medical decision-making.   EKG Interpretation   Date/Time:  Saturday October 10 2015 10:54:14 EST Ventricular  Rate:  103 PR Interval:  131 QRS Duration: 86 QT Interval:  336 QTC Calculation: 440 R Axis:   28 Text Interpretation:  Sinus tachycardia Confirmed by Annelyse Rey  MD, Sanya Kobrin  ([id number]) on 10/10/2015 11:23:55 AM      MDM   Final diagnoses:  Headache, unspecified headache type   Patient presents with frontal headache. She has remote history of pseudotumor cerebri. No obvious neurological deficits. No stiff neck. CT head negative. She feels much better after IV fluids and pain management. Discharge medications Percocet and Phenergan 25 mg.  I personally performed the services described in this documentation, which was scribed in my presence. The recorded information has been reviewed and is accurate.      Donnetta Hutching, MD 10/10/15 1331

## 2015-10-10 NOTE — ED Notes (Addendum)
Pt reports a progressively worsening headache x 2 weeks, blurred vision, and intermittent Lt sided facial numbness and RT hand numbness that began this morning at 0800.  MD Adriana Simasook notified.

## 2015-10-10 NOTE — ED Notes (Signed)
MD at bedside. 

## 2015-10-10 NOTE — Discharge Instructions (Signed)
CT scan of head was normal. Medication for pain and nausea. Follow-up your doctor.

## 2015-10-12 DIAGNOSIS — R509 Fever, unspecified: Secondary | ICD-10-CM | POA: Diagnosis not present

## 2015-10-12 DIAGNOSIS — G932 Benign intracranial hypertension: Secondary | ICD-10-CM | POA: Diagnosis not present

## 2015-10-12 DIAGNOSIS — R51 Headache: Secondary | ICD-10-CM | POA: Diagnosis not present

## 2015-10-14 ENCOUNTER — Encounter (HOSPITAL_COMMUNITY): Payer: Self-pay | Admitting: *Deleted

## 2015-10-14 DIAGNOSIS — Z880 Allergy status to penicillin: Secondary | ICD-10-CM | POA: Insufficient documentation

## 2015-10-14 DIAGNOSIS — R079 Chest pain, unspecified: Secondary | ICD-10-CM | POA: Diagnosis present

## 2015-10-14 DIAGNOSIS — Z8659 Personal history of other mental and behavioral disorders: Secondary | ICD-10-CM | POA: Diagnosis not present

## 2015-10-14 DIAGNOSIS — R51 Headache: Secondary | ICD-10-CM | POA: Diagnosis not present

## 2015-10-14 DIAGNOSIS — R0789 Other chest pain: Secondary | ICD-10-CM | POA: Insufficient documentation

## 2015-10-14 NOTE — ED Notes (Signed)
Pt c/o chest pain that started today; pt states she was just at [facility] and had a spinal tap performed but was told it was just viral; pt states she still does not feel well

## 2015-10-15 ENCOUNTER — Emergency Department (HOSPITAL_COMMUNITY)
Admission: EM | Admit: 2015-10-15 | Discharge: 2015-10-15 | Disposition: A | Discharge status: Home / Self Care | Payer: Medicaid Other | Attending: Emergency Medicine | Admitting: Emergency Medicine

## 2015-10-15 ENCOUNTER — Emergency Department (HOSPITAL_COMMUNITY): Payer: Medicaid Other

## 2015-10-15 DIAGNOSIS — R0789 Other chest pain: Secondary | ICD-10-CM | POA: Diagnosis not present

## 2015-10-15 MED ORDER — HYDROCODONE-ACETAMINOPHEN 5-325 MG PO TABS
1.0000 | ORAL_TABLET | Freq: Once | ORAL | Status: AC
  Administered 2015-10-15: 1 via ORAL
  Filled 2015-10-15: qty 1

## 2015-10-15 MED ORDER — IBUPROFEN 800 MG PO TABS
800.0000 mg | ORAL_TABLET | Freq: Once | ORAL | Status: AC
  Administered 2015-10-15: 800 mg via ORAL
  Filled 2015-10-15: qty 1

## 2015-10-15 MED ORDER — ONDANSETRON HCL 4 MG PO TABS
4.0000 mg | ORAL_TABLET | Freq: Once | ORAL | Status: AC
  Administered 2015-10-15: 4 mg via ORAL
  Filled 2015-10-15: qty 1

## 2015-10-15 NOTE — ED Provider Notes (Signed)
CSN: [id number]     Arrival date & time 10/14/15  2047 History   First MD Initiated Contact with Patient 10/14/15 2352     Chief Complaint  Patient presents with  . Chest Pain     (Consider location/radiation/quality/duration/timing/severity/associated sxs/prior Treatment) HPI Comments: Patient is a 25 year old female who presents to the emergency department with a complaint of anterior chest pain.  The patient states that at approximately 8 PM tonight she began having problems with an anterior area chest pain. Shortly after this she noted left arm pain that I would move from a tingling sensation to a pain in back to a tingling sensation, she states this went on for a little over an hour. She states now that her pain is no longer sharp, but feels tight as though she is "about to have a palpitation". The patient denies any recent injury or trauma to the chest. It is of note that she has been bothered over the last week or more with a viral illness. She had an extensive workup of this at the [facility]. The patient further denies any loss of consciousness. No sweats. Some nausea, but no actual vomiting. She's not had any fever today.  Patient is a 25 y.o. female presenting with chest pain. The history is provided by the patient.  Chest Pain Associated symptoms: headache     Past Medical History  Diagnosis Date  . Pseudotumor cerebri   . 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 Panic attacks    Past Surgical History  Procedure Laterality Date  . Lumbar puncture    . Pyloromyotomy    . Tonsillectomy    . Addenoidectomy     Family History  Problem Relation Age of Onset  . Asthma Father   . Asthma Sister   . Mental illness Sister   . Diabetes Maternal Grandmother   . Cancer Paternal Grandmother   . Diabetes Paternal Grandfather    Social History  Substance Use Topics  . Smoking status: Never Smoker    . Smokeless tobacco: Never Used  . Alcohol Use: No   OB History    Gravida Para Term Preterm AB TAB SAB Ectopic Multiple Living   0 0 0 0 0 0 2     Review of Systems  Respiratory: Positive for chest tightness.   Cardiovascular: Positive for chest pain. Negative for leg swelling.  Neurological: Positive for headaches.  All other systems reviewed and are negative.     Allergies  Penicillins  Home Medications   Prior to Admission medications   Medication Sig Start Date End Date Taking? Authorizing Provider  ibuprofen (ADVIL,MOTRIN) 800 MG tablet Take 1 tablet (800 mg total) by mouth every 8 (eight) hours as needed for mild pain. Patient not taking: Reported on 10/10/2015 10/09/15   Layla Maw Ward, DO  loperamide (IMODIUM) 2 MG capsule Take 1 capsule (2 mg total) by mouth 4 (four) times daily as needed for diarrhea or loose stools. Patient not taking: Reported on 10/10/2015 10/09/15   Kristen N Ward, DO  ondansetron (ZOFRAN ODT) 4 MG disintegrating tablet Take 1 tablet (4 mg total) by mouth every 8 (eight) hours as needed for nausea or vomiting. Patient not taking: Reported on 10/10/2015 10/09/15   Layla Maw Ward, DO  oxyCODONE-acetaminophen (PERCOCET/ROXICET) 5-325 MG tablet Take 1-2 tablets by mouth every 4 (four) hours as needed for  severe pain. 10/10/15   Donnetta Hutching, MD  promethazine (PHENERGAN) 25 MG tablet Take 1 tablet (25 mg total) by mouth every 6 (six) hours as needed. 10/10/15   Donnetta Hutching, MD   BP 129/76 mmHg  Pulse 93  Temp(Src) 98.7 F (37.1 C) (Oral)  Resp 24  Ht  (1.499 m)  Wt 90.719 kg  BMI 40.37 kg/m2  SpO2 100%  LMP 09/27/2015 Physical Exam  Constitutional: She is oriented to person, place, and time. She appears well-developed and well-nourished.  Non-toxic appearance.  HENT:  Head: Normocephalic.  Right Ear: Tympanic membrane and external ear normal.  Left Ear: Tympanic membrane and external ear normal.  Eyes: EOM and lids are normal. Pupils are  equal, round, and reactive to light.  Neck: Normal range of motion. Neck supple. Carotid bruit is not present.  Cardiovascular: Normal rate, regular rhythm, normal heart sounds, intact distal pulses and normal pulses.   Pulmonary/Chest: Breath sounds normal. No respiratory distress. She has no rales. She exhibits tenderness and bony tenderness. She exhibits no deformity and no retraction.  Anterior chest wall tenderness to palpation. There is symmetrical rise and fall of the chest. The patient speaks in complete sentences without problem.  Abdominal: Soft. Bowel sounds are normal. There is no tenderness. There is no guarding.  Musculoskeletal: Normal range of motion.  Lymphadenopathy:       Head (right side): No submandibular adenopathy present.       Head (left side): No submandibular adenopathy present.    She has no cervical adenopathy.  Neurological: She is alert and oriented to person, place, and time. She has normal strength. No cranial nerve deficit or sensory deficit.  Skin: Skin is warm and dry.  Psychiatric: She has a normal mood and affect. Her speech is normal.  Nursing note and vitals reviewed.   ED Course  Procedures (including critical care time) Labs Review Labs Reviewed - No data to display  Imaging Review No results found. I have personally reviewed and evaluated these images and lab results as part of my medical decision-making.   EKG Interpretation   Date/Time:  Wednesday October 14 2015 21:01:19 EST Ventricular Rate:  93 PR Interval:  128 QRS Duration: 86 QT Interval:  372 QTC Calculation: 462 R Axis:   22 Text Interpretation:  Normal sinus rhythm Normal ECG Confirmed by HORTON   MD, COURTNEY ([id number]) on 10/14/2015 11:53:19 PM      MDM  Vital signs are well within normal limits. The electrocardiogram shows a normal sinus rhythm at 93 bpm. There is no acute event noted. Well's Criteria low for PE. Suspect the pain is related to chest wall pain. The pulse  oximetry is 100% on room air. The patient is been speaking in complete sentences throughout her visit here to the emergency department. The patient is encouraged to continue her current medications. She is to follow-up with her primary physician, or return to the emergency department if any changes, problems, or concerns.    Final diagnoses:  None    **I have reviewed nursing notes, vital signs, and all appropriate lab and imaging results for this patient.Ivery Quale, PA-C 10/15/15 6045  Shon Baton, MD 10/16/15 [phone]

## 2015-10-15 NOTE — Discharge Instructions (Signed)
Your electrocardiogram shows a normal sinus rhythm without any acute event. Your vital signs are well within normal limits. Your oxygen level is 100% on room air. Your chest x-ray is negative. I suspect your pain is related to chest wall pain. Please continue your current medications. See your primary physician, or return to the emergency department if any changes, problems, or concerns. Chest Wall Pain Chest wall pain is pain in or around the bones and muscles of your chest. Sometimes, an injury causes this pain. Sometimes, the cause may not be known. This pain may take several weeks or longer to get better. HOME CARE INSTRUCTIONS  Pay attention to any changes in your symptoms. Take these actions to help with your pain:   Rest as told by your health care provider.   Avoid activities that cause pain. These include any activities that use your chest muscles or your abdominal and side muscles to lift heavy items.   If directed, apply ice to the painful area:  Put ice in a plastic bag.  Place a towel between your skin and the bag.  Leave the ice on for 20 minutes, 2-3 times per day.  Take over-the-counter and prescription medicines only as told by your health care provider.  Do not use tobacco products, including cigarettes, chewing tobacco, and e-cigarettes. If you need help quitting, ask your health care provider.  Keep all follow-up visits as told by your health care provider. This is important. SEEK MEDICAL CARE IF:  You have a fever.  Your chest pain becomes worse.  You have new symptoms. SEEK IMMEDIATE MEDICAL CARE IF:  You have nausea or vomiting.  You feel sweaty or light-headed.  You have a cough with phlegm (sputum) or you cough up blood.  You develop shortness of breath.   This information is not intended to replace advice given to you by your health care provider. Make sure you discuss any questions you have with your health care provider.   Document Released:  09/12/2005 Document Revised: 06/03/2015 Document Reviewed: 12/08/2014 Elsevier Interactive Patient Education [employer]

## 2015-10-30 ENCOUNTER — Encounter (HOSPITAL_COMMUNITY): Payer: Self-pay | Admitting: Family Medicine

## 2015-10-30 ENCOUNTER — Emergency Department (HOSPITAL_COMMUNITY)
Admission: EM | Admit: 2015-10-30 | Discharge: 2015-10-30 | Disposition: A | Discharge status: Home / Self Care | Payer: Medicaid Other | Attending: Emergency Medicine | Admitting: Emergency Medicine

## 2015-10-30 ENCOUNTER — Emergency Department (HOSPITAL_COMMUNITY): Payer: Medicaid Other

## 2015-10-30 DIAGNOSIS — Z79899 Other long term (current) drug therapy: Secondary | ICD-10-CM | POA: Diagnosis not present

## 2015-10-30 DIAGNOSIS — R079 Chest pain, unspecified: Secondary | ICD-10-CM | POA: Insufficient documentation

## 2015-10-30 DIAGNOSIS — R197 Diarrhea, unspecified: Secondary | ICD-10-CM | POA: Diagnosis not present

## 2015-10-30 DIAGNOSIS — Z880 Allergy status to penicillin: Secondary | ICD-10-CM | POA: Insufficient documentation

## 2015-10-30 DIAGNOSIS — R5383 Other fatigue: Secondary | ICD-10-CM | POA: Diagnosis not present

## 2015-10-30 DIAGNOSIS — M940 Chondrocostal junction syndrome [Tietze]: Secondary | ICD-10-CM | POA: Diagnosis not present

## 2015-10-30 DIAGNOSIS — R0789 Other chest pain: Secondary | ICD-10-CM | POA: Diagnosis not present

## 2015-10-30 DIAGNOSIS — R531 Weakness: Secondary | ICD-10-CM | POA: Diagnosis not present

## 2015-10-30 LAB — BASIC METABOLIC PANEL
ANION GAP: 13 (ref 5–15)
BUN: 9 mg/dL (ref 6–20)
CHLORIDE: 104 mmol/L (ref 101–111)
CO2: 24 mmol/L (ref 22–32)
Calcium: 9.6 mg/dL (ref 8.9–10.3)
Creatinine, Ser: 0.6 mg/dL (ref 0.44–1.00)
Glucose, Bld: 108 mg/dL — ABNORMAL HIGH (ref 65–99)
POTASSIUM: 3.9 mmol/L (ref 3.5–5.1)
SODIUM: 141 mmol/L (ref 135–145)

## 2015-10-30 LAB — CBC
HEMATOCRIT: 42.6 % (ref 36.0–46.0)
Hemoglobin: 14.4 g/dL (ref 12.0–15.0)
MCH: 29 pg (ref 26.0–34.0)
MCHC: 33.8 g/dL (ref 30.0–36.0)
MCV: 85.9 fL (ref 78.0–100.0)
Platelets: 238 10*3/uL (ref 150–400)
RBC: 4.96 MIL/uL (ref 3.87–5.11)
RDW: 13.4 % (ref 11.5–15.5)
WBC: 7 10*3/uL (ref 4.0–10.5)

## 2015-10-30 LAB — I-STAT TROPONIN, ED: Troponin i, poc: 0 ng/mL (ref 0.00–0.08)

## 2015-10-30 NOTE — ED Notes (Signed)
Pt. Was at [facility] January 16th and had a spinal tap to R/o meningitis. And also to relieve the fluid due to pseudotumor cerebra.    Negative for Meningitis , placed on a anitviral 2 doses.   Since then she has been having intermittent chest pain, with sob.  .  For approximately 2 weeks ago, Also reports having intermittent numbness to her lt. Arm

## 2015-10-30 NOTE — ED Provider Notes (Signed)
CSN: [id number]     Arrival date & time 10/30/15  1159 History   First MD Initiated Contact with Patient 10/30/15 1228     Chief Complaint  Patient presents with  . Chest Pain     (Consider location/radiation/quality/duration/timing/severity/associated sxs/prior Treatment) HPI   68 y f w pmh pseudotumor, coming in with complaint of chest pain.  Patient has had weeks now of intermittent central chest tightness.  Associated with the chest tightness she has tingling of her L arm.  She currently has no left arm numbness, no headache, no neck pain.  She has not injured her L arm and has no pain in the arm.  She has no h/o htn/hl/dm.  She has been seen for similar chest pain to this in the past and has had normal ekg/cxr.     Past Medical History  Diagnosis Date  . Pseudotumor cerebri   . 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 Panic attacks    Past Surgical History  Procedure Laterality Date  . Lumbar puncture    . Pyloromyotomy    . Tonsillectomy    . Addenoidectomy     Family History  Problem Relation Age of Onset  . Asthma Father   . Asthma Sister   . Mental illness Sister   . Diabetes Maternal Grandmother   . Cancer Paternal Grandmother   . Diabetes Paternal Grandfather    Social History  Substance Use Topics  . Smoking status: Never Smoker   . Smokeless tobacco: Never Used  . Alcohol Use: No   OB History    Gravida Para Term Preterm AB TAB SAB Ectopic Multiple Living   0 0 0 0 0 0 2     Review of Systems  Constitutional: Negative for fever and chills.  HENT: Negative for nosebleeds.   Eyes: Negative for visual disturbance.  Respiratory: Positive for chest tightness. Negative for cough and shortness of breath.   Cardiovascular: Positive for chest pain.  Gastrointestinal: Negative for nausea, vomiting, abdominal pain, diarrhea and constipation.  Genitourinary: Negative for dysuria.  Skin:  Negative for rash.  Neurological: Negative for weakness.  All other systems reviewed and are negative.     Allergies  Penicillins  Home Medications   Prior to Admission medications   Medication Sig Start Date End Date Taking? Authorizing Provider  acetaminophen (TYLENOL) 325 MG tablet Take 650 mg by mouth every 6 (six) hours as needed for mild pain.   Yes Historical Provider, MD  HYDROcodone-acetaminophen (NORCO/VICODIN) 5-325 MG tablet Take 1 tablet by mouth every 4 (four) hours as needed. pain 10/12/15  Yes Historical Provider, MD  ibuprofen (ADVIL,MOTRIN) 800 MG tablet Take 1 tablet (800 mg total) by mouth every 8 (eight) hours as needed for mild pain. 10/09/15  Yes Kristen N Ward, DO  oxyCODONE-acetaminophen (PERCOCET/ROXICET) 5-325 MG tablet Take 1-2 tablets by mouth every 4 (four) hours as needed for severe pain. 10/10/15  Yes Donnetta Hutching, MD  loperamide (IMODIUM) 2 MG capsule Take 1 capsule (2 mg total) by mouth 4 (four) times daily as needed for diarrhea or loose stools. Patient not taking: Reported on 10/10/2015 10/09/15   Kristen N Ward, DO  ondansetron (ZOFRAN ODT) 4 MG disintegrating tablet Take 1 tablet (4 mg total) by mouth every 8 (eight) hours as needed for nausea or [id number]. Patient not taking: Reported on 10/10/2015 10/09/15   Layla Maw Ward,  DO  promethazine (PHENERGAN) 25 MG tablet Take 1 tablet (25 mg total) by mouth every 6 (six) hours as needed. Patient not taking: Reported on 10/30/2015 10/10/15   Donnetta Hutching, MD   BP 124/86 mmHg  Pulse 81  Temp(Src) 97.9 F (36.6 C) (Oral)  Resp 18  SpO2 99%  LMP 09/27/2015 Physical Exam  Constitutional: She is oriented to person, place, and time. No distress.  HENT:  Head: Normocephalic and atraumatic.  Eyes: EOM are normal. Pupils are equal, round, and reactive to light.  Neck: Normal range of motion. Neck supple.  Cardiovascular: Normal rate, normal heart sounds and intact distal pulses.   Pulmonary/Chest: Effort normal and  breath sounds normal. No respiratory distress.  Abdominal: Soft. There is no tenderness.  Musculoskeletal: Normal range of motion.  Neurological: She is alert and oriented to person, place, and time. She has normal strength. She is not disoriented. No cranial nerve deficit or sensory deficit. Coordination and gait normal. GCS eye subscore is 4. GCS verbal subscore is 5. GCS motor subscore is 6.  Skin: No rash noted. She is not diaphoretic.  Psychiatric: She has a normal mood and affect.    ED Course  Procedures (including critical care time) Labs Review Labs Reviewed  BASIC METABOLIC PANEL - Abnormal; Notable for the following:    Glucose, Bld 108 (*)    All other components within normal limits  CBC  I-STAT TROPOININ, ED    Imaging Review Dg Chest 2 View  10/30/2015  CLINICAL DATA:  Chest pain and left arm numbness EXAM: CHEST  2 VIEW COMPARISON:  10/15/2015 FINDINGS: The heart size and mediastinal contours are within normal limits. Both lungs are clear. The visualized skeletal structures are unremarkable. IMPRESSION: No active cardiopulmonary disease. Electronically Signed   By: Alcide Clever M.D.   On: 10/30/2015 12:29   I have personally reviewed and evaluated these images and lab results as part of my medical decision-making.   EKG Interpretation   Date/Time:  Friday October 30 2015 12:05:32 EST Ventricular Rate:  79 PR Interval:  126 QRS Duration: 78 QT Interval:  376 QTC Calculation: 431 R Axis:   31 Text Interpretation:  Normal sinus rhythm Normal ECG No significant change  since last tracing Confirmed by STEINL  MD, Caryn Bee ([id number]) on 10/30/2015  1:11:45 PM      MDM   Final diagnoses:  Chest pain, unspecified chest pain type     24 y f w pmh pseudotumor, coming in with complaint of chest pain.  Exam as above.  ekg wnl.  AFVSS.  Patient is perc neg, doubt PE cxr obtained and unremarkable. Will obtain cbc/bmp/trop.  Patient is low risk for acs based off of age  and risk factors.    Cbc/bmp unremarkable.  Trop neg.  Feel safe for d/c.  Given patient with pain for weeks on and off, doubt acs as I feel her trop should be positive by now.  Doubt dissection given description of pain, age, no h/o htn. Will have pt f/u with neuro for continued management of her pseudotumor as well as her intermittent L arm numbness.  She has no neuro deficits at this time, doubt CVA.  Good L radial pulse, doubt vascular cause.  MS would be a possibility but low likelihood.  I have discussed the results, Dx and Tx plan with the pt. They expressed understanding and agree with the plan and were told to return to ED with any worsening of condition or concern.  Disposition: Discharge  Condition: Good  Discharge Medication List as of 10/30/2015  1:35 PM      Follow Up: [facility] [address]     [address] [place] [address] [phone] In 1 week   [facility] EMERGENCY DEPARTMENT [address] [id number] mc [place] [address] [phone]  As needed, If symptoms worsen   Pt seen in conjunction with Dr. Imagene Sheller, MD 10/30/15 1634  Cathren Laine, MD 10/31/15 [phone]

## 2015-10-30 NOTE — ED Notes (Signed)
Pt here for intermittent chest pain with left arm pain and numbness over the past 2 weeks. sts also recent virus with very high fever.

## 2015-10-30 NOTE — ED Notes (Signed)
Pt ambulated to restroom. 

## 2015-10-30 NOTE — ED Notes (Signed)
Pt. Just arrived to Room 30

## 2015-10-30 NOTE — Discharge Instructions (Signed)
Chest Pain Observation °It is often hard to give a specific diagnosis for the cause of chest pain. Among other possibilities your symptoms might be caused by inadequate oxygen delivery to your heart (angina). Angina that is not treated or evaluated can lead to a heart attack (myocardial infarction) or death. °Blood tests, electrocardiograms, and X-rays may have been done to help determine a possible cause of your chest pain. After evaluation and observation, your health care provider has determined that it is unlikely your pain was caused by an unstable condition that requires hospitalization. However, a full evaluation of your pain may need to be completed, with additional diagnostic testing as directed. It is very important to keep your follow-up appointments. Not keeping your follow-up appointments could result in permanent heart damage, disability, or death. If there is any problem keeping your follow-up appointments, you must call your health care provider. °HOME CARE INSTRUCTIONS  °Due to the slight chance that your pain could be angina, it is important to follow your health care provider's treatment plan and also maintain a healthy lifestyle: °· Maintain or work toward achieving a healthy weight. °· Stay physically active and exercise regularly. °· Decrease your salt intake. °· Eat a balanced, healthy diet. Talk to a dietitian to learn about heart-healthy foods. °· Increase your fiber intake by including whole grains, vegetables, fruits, and nuts in your diet. °· Avoid situations that cause stress, anger, or depression. °· Take medicines as advised by your health care provider. Report any side effects to your health care provider. Do not stop medicines or adjust the dosages on your own. °· Quit smoking. Do not use nicotine patches or gum until you check with your health care provider. °· Keep your blood pressure, blood sugar, and cholesterol levels within normal limits. °· Limit alcohol intake to no more than  1 drink per day for women who are not pregnant and 2 drinks per day for men. °· Do not abuse drugs. °SEEK IMMEDIATE MEDICAL CARE IF: °You have severe chest pain or pressure which may include symptoms such as: °· You feel pain or pressure in your arms, neck, jaw, or back. °· You have severe back or abdominal pain, feel sick to your stomach (nauseous), or throw up (vomit). °· You are sweating profusely. °· You are having a fast or irregular heartbeat. °· You feel short of breath while at rest. °· You notice increasing shortness of breath during rest, sleep, or with activity. °· You have chest pain that does not get better after rest or after taking your usual medicine. °· You wake from sleep with chest pain. °· You are unable to sleep because you cannot breathe. °· You develop a frequent cough or you are coughing up blood. °· You feel dizzy, faint, or experience extreme fatigue. °· You develop severe weakness, dizziness, fainting, or chills. °Any of these symptoms may represent a serious problem that is an emergency. Do not wait to see if the symptoms will go away. Call your local emergency services (911 in the [place] Do not drive yourself to the hospital. °MAKE SURE YOU: °· Understand these instructions. °· Will watch your condition. °· Will get help right away if you are not doing well or get worse. °  °This information is not intended to replace advice given to you by your health care provider. Make sure you discuss any questions you have with your health care provider. °  °Document Released: 10/15/2010 Document Revised: 09/17/2013 Document Reviewed: 03/14/2013 °Elsevier Interactive Patient   Education 2016 [employer].     Emergency Department Resource Guide 1) Find a Doctor and Pay Out of Pocket Although you won't have to find out who is covered by your insurance plan, it is a good idea to ask around and get recommendations. You will then need to call the office and see if the doctor you have chosen will  accept you as a new patient and what types of options they offer for patients who are self-pay. Some doctors offer discounts or will set up payment plans for their patients who do not have insurance, but you will need to ask so you aren't surprised when you get to your appointment.  2) Contact Your Local Health Department Not all health departments have doctors that can see patients for sick visits, but many do, so it is worth a call to see if yours does. If you don't know where your local health department is, you can check in your phone book. The CDC also has a tool to help you locate your state's health department, and many state websites also have listings of all of their local health departments.  3) Find a Walk-in Clinic If your illness is not likely to be very severe or complicated, you may want to try a walk in clinic. These are popping up all over the country in pharmacies, drugstores, and shopping centers. They're usually staffed by nurse practitioners or physician assistants that have been trained to treat common illnesses and complaints. They're usually fairly quick and inexpensive. However, if you have serious medical issues or chronic medical problems, these are probably not your best option.  No Primary Care Doctor: - Call Health Connect at  [phone] - they can help you locate a primary care doctor that  accepts your insurance, provides certain services, etc. - Physician Referral Service- [phone]  Chronic Pain Problems: Organization         Address  Phone   Notes  [facility]  [phone] Patients need to be referred by their primary care doctor.   Medication Assistance: Organization         Address  Phone   Notes  [facility] [address] [place] [place], [place] [address] [phone] --Must be a resident of [facility] -- Must have NO insurance coverage whatsoever (no Medicaid/ Medicare, etc.) -- The pt.  MUST have a primary care doctor that directs their care regularly and follows them in the community   [facility]ist  [phone]   [employer]  [phone]    Agencies that provide inexpensive medical care: Organization         Address  Phone   Notes  [facility]  [phone]   [facility]    [phone]   [facility] [address] [place], [place] [address] [phone]   [facility] [address] [place] [place] [phone]   Planned Parenthood    [phone]   [facility]    [phone]   [facility]  [address], [place] Phone:  [phone], Fax:  [phone] Hours of Operation:  9 am - 6 pm, M-F.  Also accepts Medicaid/Medicare and self-pay.  [facility]  [address], [place] Phone: [phone], Fax: [phone]. Hours of Operation:  8:30 am - 5:30 pm, M-F.  Also  accepts Medicaid and self-pay.  [facility] [place] [address] [place] Phone: [phone]   [facility] [address] Natasha Bence [place], [place] [phone], Ext. [phone] Mondays & Thursdays: 7-9 AM.  First 15 patients are seen on a first come, first serve basis.    Medicaid-accepting [facility] Providers:  Organization         Address  Phone   Notes  [facility] [address], [place] [phone] Also accepts self-pay patients.  [facility] [address] Laurell Josephs [place], [place]  [phone]   [facility] [address], [place] [phone]   [facility] [address] [place] [phone]   Renaye Rakers [address], [place]   [phone] Only accepts [place] Access [place] patients after they have their name applied to their card.   Self-Pay (no insurance) in  [facility]:  Organization         Address  Phone   Notes  Sickle Cell Patients, [facility] [address] [place], [place] [phone]   [facility] [address] [place], [place] [phone]   [facility] [place]  [address], [place] [phone]   [facility]/Dr. Osei-Bonsu  [address], [place] or [address], [place] [phone] Phone number for both [place] and [place] locations is the same.  [facility] and [facility] [address], [place] [phone]   [facility] [address] [place] or [address] [phone]   [facility] [address] [place] [phone], phone; [phone], fax Sees patients 1st and 3rd Saturday of every month.  Must not qualify for public or private insurance (i.e. Medicaid, Medicare, [place] Health Choice, Veterans' Benefits)  Household income should be no more than 200% of the poverty level The clinic cannot treat you if you are pregnant or think you are pregnant  Sexually transmitted diseases are not treated at the clinic.    Dental Care: Organization         Address  Phone  Notes  [facility] [place] Department of [facility] [address] [place], [place] [phone] Accepts children up to age 64 who are enrolled in [place] or [place] Health Choice; pregnant women with a Medicaid card; and children who have applied for Medicaid or [place] Health Choice, but were declined, whose parents can pay a reduced fee at time of service.  [facility] Department of [facility]  [address], [place] [phone] Accepts children up to age 35 who are enrolled in [place] or [place] Health Choice; pregnant women with a Medicaid card; and children who have applied for Medicaid or  Health Choice, but were declined, whose  parents can pay a reduced fee at time of service.  [facility]  [address] [place], [place] [phone] Patients are seen by appointment only. Walk-ins are not accepted. [facility] will see patients 64 years of age and older. Monday - Tuesday (8am-5pm) Most Wednesdays (8:30-5pm) $30 per visit, cash only  [facility] Adult Dental Access PROGRAM  [address], [facility] [phone] Patients are seen by appointment only. Walk-ins are not accepted. [facility] will  see patients 54 years of age and older. One Wednesday Evening (Monthly: Volunteer Based).  $30 per visit, cash only  [employer]  [phone] for adults; Children under age 47, call Graduate Pediatric Dentistry at [phone]. Children aged 32-14, please call [phone] to request a pediatric application.  Dental services are provided in all areas of dental care including fillings, crowns and bridges, complete and partial dentures, implants, gum treatment, root canals, and extractions. Preventive care is also provided. Treatment is provided to both adults and children. Patients are selected via a lottery and there is often a waiting list.   [facility] [address], [place]  [phone] [email or web address]   [facility] [address] [place], [place] [phone], Ext. [phone] Second and Fourth Thursday of each month, opens at 6:30 AM; Clinic ends at 9 AM.  Patients are seen on a first-come first-served basis, and a limited number are seen during each clinic.   [facility]  [address] Ether Griffins [place], [place] [phone]   Eligibility Requirements You must have lived in [place], [place], or [place] counties for at least the last three months.   You cannot be eligible for state or federal sponsored [place], including [employer], [place], or [employer].   You generally cannot be eligible for  healthcare insurance through your employer.    How to apply: Eligibility screenings are held every Tuesday and Wednesday afternoon from 1:00 pm until 4:00 pm. You do not need an appointment for the interview!  [facility] [address], [place], [place] [phone]   [facility]  [phone]   [facility] Health Department  [phone]   [facility] Health Department  [phone]    Behavioral Health Resources in the Community: Intensive Outpatient Programs Organization         Address  Phone  Notes  [facility] Services [address] [place], [place] [phone]   [facility] Outpatient [address] [place], [place] [phone]   [facility] [address], [place], [place]  [phone]   [facility] Health [address]  [place], [place] [phone] or [phone]   Substance Abuse Resources Organization         Address  Phone  Notes  [facility]  [phone]   [facility]  [phone]   [facility]  [phone]   Floydene Flock  [phone]   Residential & Outpatient Substance Abuse Program  [phone]   Psychological Services Organization         Address  Phone  Notes  [facility]al Health  [phone]   [facility] Services  [phone]   [facility] [address], [place] [phone] or [phone]    Mobile Crisis Teams Organization         Address  Phone  Notes  [facility], Mobile Crisis Care Unit  [phone]   [facility]  [address] [place], [place] [phone]   Doristine Locks [address] [place] [phone]    Self-Help/Support Groups Organization         Address  Phone             Notes  [facility] [place] - variety of support groups  336- I7437963 Call for more information  Narcotics  Anonymous (NA), [facility] [address],  [place]  2 meetings at this location   Residential Treatment Programs Organization         Address  Phone  Notes  [facility] [address]    [place]  [phone]   [facility]  [address], [place] [address], [place], [place] [phone]   [facility] [address] [place], [place] [phone] Admissions: 8am-3pm M-F  [facility] [address]    [place], [place] [phone]   [facility] [address] [place], [place], [place] [phone]   [facility] [address]  [place], [place] [phone]   [facility] - Intensive Outpatient [address] Laurell Josephs [address], [place], [place] [phone]   [facility] ([facility] [address] [place]  [place], [place] [phone] or [phone]   [facility] ([facility]) [address], [place], [place] [phone] Accepts Medicaid  [facility] [address]  [place] [phone] Substance Abuse/Addiction Treatment   [facility] Organization         Address  Phone  Notes  [facility]  [phone]   Angie Fava, PhD [address] Ervin Knack [place], [place]   [phone] or [phone]   [facility]   [address] [place], [place] [phone]   [facility] [address] [place], [place] [phone] Insurance/Medicaid/sponsorship through [facility] [address]                                    [place], [place] [phone] Therapy/tele-psych/case  [facility] [address], [place] [phone]    Dr. Lolly Mustache  [phone]   [facility] [place]  [employer] [facility] Dept. 1) [address] [place] 2) [address], [place] 3)  [address], [place] [phone]  [phone]   [facility] Child Abuse Hotline (510)283-5767 or 854-613-8273 (After Hours)

## 2017-02-16 ENCOUNTER — Other Ambulatory Visit (HOSPITAL_COMMUNITY)
Admission: RE | Admit: 2017-02-16 | Discharge: 2017-02-16 | Disposition: A | Discharge status: Home / Self Care | Payer: 59 | Source: Ambulatory Visit | Attending: Family Medicine | Admitting: Family Medicine

## 2017-02-16 ENCOUNTER — Other Ambulatory Visit: Payer: Self-pay | Admitting: Family Medicine

## 2017-02-16 DIAGNOSIS — Z124 Encounter for screening for malignant neoplasm of cervix: Secondary | ICD-10-CM | POA: Diagnosis not present

## 2017-02-16 DIAGNOSIS — Z131 Encounter for screening for diabetes mellitus: Secondary | ICD-10-CM | POA: Diagnosis not present

## 2017-02-16 DIAGNOSIS — Z13220 Encounter for screening for lipoid disorders: Secondary | ICD-10-CM | POA: Diagnosis not present

## 2017-02-16 DIAGNOSIS — Z01411 Encounter for gynecological examination (general) (routine) with abnormal findings: Secondary | ICD-10-CM | POA: Diagnosis not present

## 2017-02-16 DIAGNOSIS — Z01419 Encounter for gynecological examination (general) (routine) without abnormal findings: Secondary | ICD-10-CM | POA: Insufficient documentation

## 2017-02-16 DIAGNOSIS — Z713 Dietary counseling and surveillance: Secondary | ICD-10-CM | POA: Diagnosis not present

## 2017-02-16 DIAGNOSIS — N6310 Unspecified lump in the right breast, unspecified quadrant: Secondary | ICD-10-CM | POA: Diagnosis not present

## 2017-02-16 DIAGNOSIS — Z6841 Body Mass Index (BMI) 40.0 and over, adult: Secondary | ICD-10-CM | POA: Diagnosis not present

## 2017-02-17 ENCOUNTER — Other Ambulatory Visit: Payer: Self-pay | Admitting: Family Medicine

## 2017-02-17 DIAGNOSIS — N6310 Unspecified lump in the right breast, unspecified quadrant: Secondary | ICD-10-CM

## 2017-02-17 DIAGNOSIS — N6452 Nipple discharge: Secondary | ICD-10-CM

## 2017-02-21 LAB — CYTOLOGY - PAP: Diagnosis: NEGATIVE

## 2017-04-14 ENCOUNTER — Ambulatory Visit
Admission: RE | Admit: 2017-04-14 | Discharge: 2017-04-14 | Disposition: A | Discharge status: Home / Self Care | Payer: 59 | Source: Ambulatory Visit | Attending: Family Medicine | Admitting: Family Medicine

## 2017-04-14 DIAGNOSIS — N6452 Nipple discharge: Secondary | ICD-10-CM

## 2017-04-14 DIAGNOSIS — N6310 Unspecified lump in the right breast, unspecified quadrant: Secondary | ICD-10-CM

## 2017-05-17 ENCOUNTER — Encounter (HOSPITAL_COMMUNITY): Payer: Self-pay | Admitting: *Deleted

## 2017-05-17 ENCOUNTER — Emergency Department (HOSPITAL_COMMUNITY): Payer: Self-pay

## 2017-05-17 ENCOUNTER — Other Ambulatory Visit: Payer: Self-pay

## 2017-05-17 DIAGNOSIS — M62831 Muscle spasm of calf: Secondary | ICD-10-CM | POA: Insufficient documentation

## 2017-05-17 DIAGNOSIS — R0789 Other chest pain: Secondary | ICD-10-CM | POA: Insufficient documentation

## 2017-05-17 LAB — BASIC METABOLIC PANEL
Anion gap: 9 (ref 5–15)
BUN: 15 mg/dL (ref 6–20)
CHLORIDE: 103 mmol/L (ref 101–111)
CO2: 26 mmol/L (ref 22–32)
Calcium: 9.5 mg/dL (ref 8.9–10.3)
Creatinine, Ser: 0.87 mg/dL (ref 0.44–1.00)
GFR calc non Af Amer: >60 mL/min (ref >60)
GLUCOSE: 99 mg/dL (ref 65–99)
Potassium: 3.5 mmol/L (ref 3.5–5.1)
Sodium: 138 mmol/L (ref 135–145)

## 2017-05-17 LAB — CBC
HCT: 41.4 % (ref 36.0–46.0)
Hemoglobin: 13.3 g/dL (ref 12.0–15.0)
MCH: 26.8 pg (ref 26.0–34.0)
MCHC: 32.1 g/dL (ref 30.0–36.0)
MCV: 83.5 fL (ref 78.0–100.0)
PLATELETS: 247 10*3/uL (ref 150–400)
RBC: 4.96 MIL/uL (ref 3.87–5.11)
RDW: 14.8 % (ref 11.5–15.5)
WBC: 7.5 10*3/uL (ref 4.0–10.5)

## 2017-05-17 LAB — I-STAT TROPONIN, ED: Troponin i, poc: 0 ng/mL (ref 0.00–0.08)

## 2017-05-17 NOTE — ED Triage Notes (Signed)
Pt reports left calf pain for about 3 days and now the pain extends up her thigh with swelling to the lateral thigh.  Today, has had central chest pain that radiates to the mid back area.

## 2017-05-18 ENCOUNTER — Emergency Department (HOSPITAL_COMMUNITY)
Admission: EM | Admit: 2017-05-18 | Discharge: 2017-05-18 | Disposition: A | Discharge status: Home / Self Care | Payer: Self-pay | Attending: Emergency Medicine | Admitting: Emergency Medicine

## 2017-05-18 ENCOUNTER — Ambulatory Visit (HOSPITAL_BASED_OUTPATIENT_CLINIC_OR_DEPARTMENT_OTHER)
Admission: RE | Admit: 2017-05-18 | Discharge: 2017-05-18 | Disposition: A | Discharge status: Home / Self Care | Payer: Self-pay | Source: Ambulatory Visit | Attending: Emergency Medicine | Admitting: Emergency Medicine

## 2017-05-18 DIAGNOSIS — M79609 Pain in unspecified limb: Secondary | ICD-10-CM

## 2017-05-18 DIAGNOSIS — R0789 Other chest pain: Secondary | ICD-10-CM

## 2017-05-18 DIAGNOSIS — M7989 Other specified soft tissue disorders: Secondary | ICD-10-CM

## 2017-05-18 DIAGNOSIS — M62831 Muscle spasm of calf: Secondary | ICD-10-CM

## 2017-05-18 LAB — D-DIMER, QUANTITATIVE (NOT AT ARMC): D DIMER QUANT: 0.38 ug{FEU}/mL (ref 0.00–0.50)

## 2017-05-18 MED ORDER — NAPROXEN 500 MG PO TABS: 500.0000 mg | ORAL_TABLET | Freq: Two times a day (BID) | ORAL | 0 refills | Status: DC

## 2017-05-18 NOTE — ED Provider Notes (Signed)
[facility]-EMERGENCY DEPT Provider Note   CSN: [id number] Arrival date & time: 05/17/17  2247     History   Chief Complaint Chief Complaint  Patient presents with  . Chest Pain    HPI Carol [name] is a 26 y.o. female.  Patient presents to the emergency department with complaints of left leg pain and chest pain. Patient reports that she has been having pain in her left calf for 3 days. This pain began after having a succession of severe "charley horses". Pain has persisted in the calf but now comes up into the lateral aspect of the left thigh.  Patient started having pain tonight in the center of her chest that radiates through into her back.She denies injury. She has not identified any thing that makes the pain better or worse. It is not related to touch or movement. She is not short of breath. Breathing does not worsen the pain.   Patient denies recent travel including long car rides or plane travel. She is not taking exogenous estrogen.      Past Medical History:  Diagnosis Date  . 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 Panic attacks   . Pseudotumor cerebri     Patient Active Problem List   Diagnosis Date Noted  . Depression 10/11/2013  . Headache(784.0) 06/25/2013    Past Surgical History:  Procedure Laterality Date  . addenoidectomy    . LUMBAR PUNCTURE    . PYLOROMYOTOMY    . TONSILLECTOMY      OB History    Gravida Para Term Preterm AB Living   2 2 2  0 0 2   SAB TAB Ectopic Multiple Live Births   0 0 0 0 2       Home Medications    Prior to Admission medications   Medication Sig Start Date End Date Taking? Authorizing Provider  ibuprofen (ADVIL,MOTRIN) 800 MG tablet Take 1 tablet (800 mg total) by mouth every 8 (eight) hours as needed for mild pain. Patient not taking: Reported on 05/18/2017 10/09/15   Ward, Layla Maw, DO  loperamide (IMODIUM) 2 MG capsule Take 1 capsule (2 mg  total) by mouth 4 (four) times daily as needed for diarrhea or loose stools. Patient not taking: Reported on 10/10/2015 10/09/15   Ward, Layla Maw, DO  naproxen (NAPROSYN) 500 MG tablet Take 1 tablet (500 mg total) by mouth 2 (two) times daily. 05/18/17   Gilda Crease, MD  ondansetron (ZOFRAN ODT) 4 MG disintegrating tablet Take 1 tablet (4 mg total) by mouth every 8 (eight) hours as needed for nausea or vomiting. Patient not taking: Reported on 10/10/2015 10/09/15   Ward, Layla Maw, DO  oxyCODONE-acetaminophen (PERCOCET/ROXICET) 5-325 MG tablet Take 1-2 tablets by mouth every 4 (four) hours as needed for severe pain. Patient not taking: Reported on 05/18/2017 10/10/15   Donnetta Hutching, MD  promethazine (PHENERGAN) 25 MG tablet Take 1 tablet (25 mg total) by mouth every 6 (six) hours as needed. Patient not taking: Reported on 10/30/2015 10/10/15   Donnetta Hutching, MD    Family History Family History  Problem Relation Age of Onset  . Asthma Father   . Asthma Sister   . Mental illness Sister   . Diabetes Maternal Grandmother   . Cancer Paternal Grandmother   . Diabetes Paternal Grandfather     Social History Social History  Substance Use Topics  . Smoking status:  Never Smoker  . Smokeless tobacco: Never Used  . Alcohol use No     Allergies   Penicillins   Review of Systems Review of Systems  Respiratory: Negative for shortness of breath.   Cardiovascular: Positive for chest pain.  Musculoskeletal: Positive for myalgias.  All other systems reviewed and are negative.    Physical Exam Updated Vital Signs BP 104/66   Pulse 67   Temp 98.3 F (36.8 C) (Oral)   Resp 19   Ht 4\' 11"  (1.499 m)   Wt 95.3 kg (210 lb)   LMP 05/10/2017   SpO2 94%   BMI 42.41 kg/m   Physical Exam  Constitutional: She is oriented to person, place, and time. She appears well-developed and well-nourished. No distress.  HENT:  Head: Normocephalic and atraumatic.  Right Ear: Hearing normal.  Left  Ear: Hearing normal.  Nose: Nose normal.  Mouth/Throat: Oropharynx is clear and moist and mucous membranes are normal.  Eyes: Pupils are equal, round, and reactive to light. Conjunctivae and EOM are normal.  Neck: Normal range of motion. Neck supple.  Cardiovascular: Regular rhythm, S1 normal and S2 normal.  Exam reveals no gallop and no friction rub.   No murmur heard. Pulmonary/Chest: Effort normal and breath sounds normal. No respiratory distress. She exhibits no tenderness.  Abdominal: Soft. Normal appearance and bowel sounds are normal. There is no hepatosplenomegaly. There is no tenderness. There is no rebound, no guarding, no tenderness at McBurney's point and negative Murphy's sign. No hernia.  Musculoskeletal: Normal range of motion.       Left lower leg: She exhibits tenderness. She exhibits no swelling.  Neurological: She is alert and oriented to person, place, and time. She has normal strength. No cranial nerve deficit or sensory deficit. Coordination normal. GCS eye subscore is 4. GCS verbal subscore is 5. GCS motor subscore is 6.  Skin: Skin is warm, dry and intact. No rash noted. No cyanosis.  Psychiatric: She has a normal mood and affect. Her speech is normal and behavior is normal. Thought content normal.  Nursing note and vitals reviewed.    ED Treatments / Results  Labs (all labs ordered are listed, but only abnormal results are displayed) Labs Reviewed  BASIC METABOLIC PANEL  CBC  D-DIMER, QUANTITATIVE (NOT AT [facility])  I-STAT TROPONIN, ED    EKG  EKG Interpretation None       Radiology Dg Chest 2 View  Result Date: 05/17/2017 CLINICAL DATA:  Chest pain today. EXAM: CHEST  2 VIEW COMPARISON:  10/30/2015 CXR FINDINGS: The heart size and mediastinal contours are within normal limits. Both lungs are clear. The visualized skeletal structures are unremarkable. IMPRESSION: No active cardiopulmonary disease. Electronically Signed   By: Tollie Eth M.D.   On: 05/17/2017  23:26    Procedures Procedures (including critical care time)  Medications Ordered in ED Medications - No data to display   Initial Impression / Assessment and Plan / ED Course  I have reviewed the triage vital signs and the nursing notes.  Pertinent labs & imaging results that were available during my care of the patient were reviewed by me and considered in my medical decision making (see chart for details).     Patient presents to the emergency department for evaluation of leg pain and chest pain. She started having leg pain3 days ago. This is likely simply musculoskeletal pain. She reports that she had several back-to-back charley horses and then the pain has been present since then. This  is likely just pain secondary to the muscle spasms, but DVT is considered a possibility. Examination is not highly suspicious for DVT. D-dimer is negative which is very reassuring.  Patient also complaining of chest pain. She has no perceived shortness of breath, no hypoxia, tachypnea or tachycardia. Pain is not related to breathing or pleuritic in any way. She does not have cardiac risk factors. Troponin negative.  Because of the persistent pain in the leg, we will perform outpatient DVT study to be certain that she is not experiencing DVT. For that this is a very low likelihood. She does not require anticoagulation today, will discharge and have come back in the morning for study.  Final Clinical Impressions(s) / ED Diagnoses   Final diagnoses:  Muscle spasm of calf  Atypical chest pain    New Prescriptions New Prescriptions   NAPROXEN (NAPROSYN) 500 MG TABLET    Take 1 tablet (500 mg total) by mouth 2 (two) times daily.     Gilda Crease, MD 05/18/17 [phone]

## 2017-05-18 NOTE — Progress Notes (Signed)
VASCULAR LAB PRELIMINARY  PRELIMINARY  PRELIMINARY  PRELIMINARY  Left lower extremity venous duplex completed.    Preliminary report:  Left:  No evidence of DVT, superficial thrombosis, or Baker's cyst.  Valborg Friar, RVS 05/18/2017, 1:13 PM

## 2017-07-10 IMAGING — CT CT HEAD W/O CM
1 series · 16 of 30 positions shown, 20 images · non-contrast
Comparison: Brain MRI on 06/25/2013.

CLINICAL DATA: Acute onset of headache today. Right hand in left
facial numbness. Pseudotumor cerebral.

EXAM:
CT HEAD WITHOUT CONTRAST
TECHNIQUE: Contiguous axial images were obtained from the base of the skull
through the vertex without intravenous contrast.

[Series 2: headseq 4.8 h37s · axial · 0.49mm/px · z∈[+147,+308]mm · 16 of 36 slices shown, 20 images]
[im 2/36  brain]
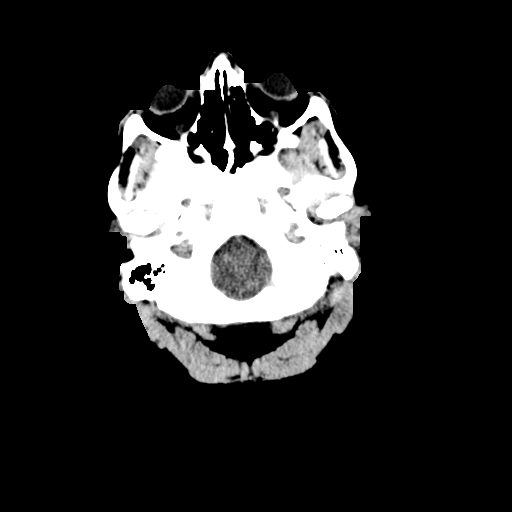
[im 2/36  bone]
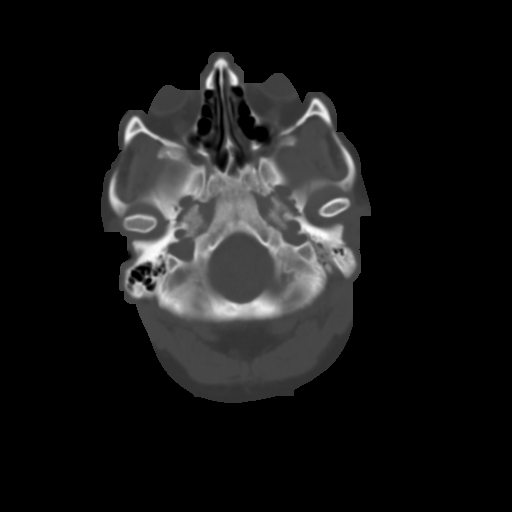
[im 4/36  brain]
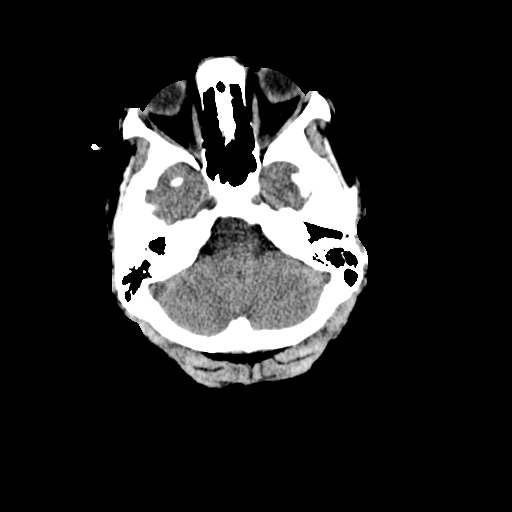
[im 7/36  brain]
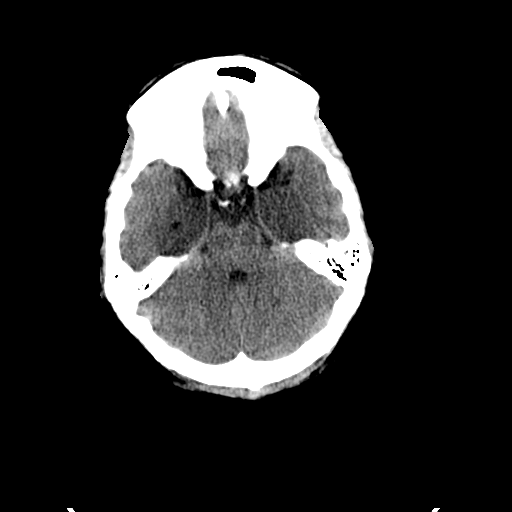
[im 9/36  brain]
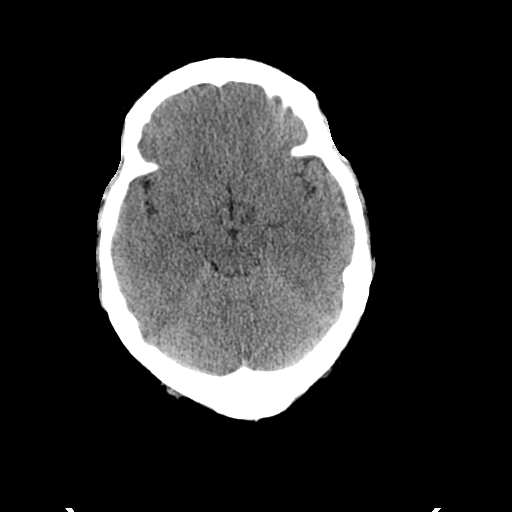
[im 10/36  brain]
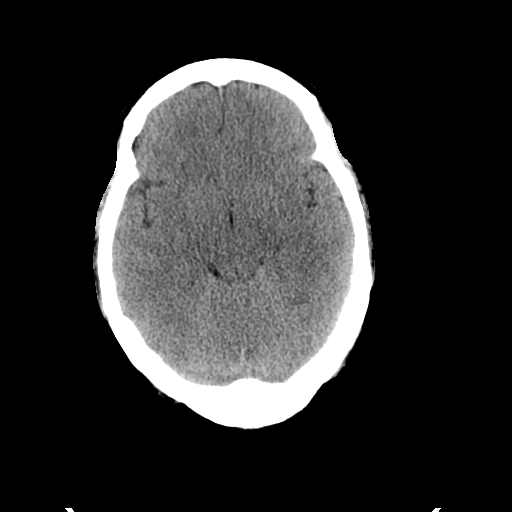
[im 10/36  bone]
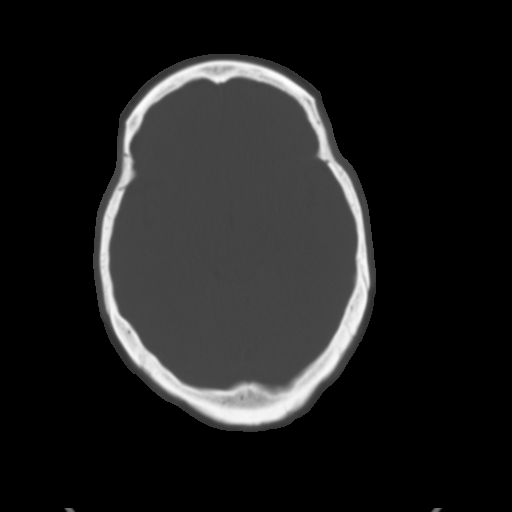
[im 13/36  brain]
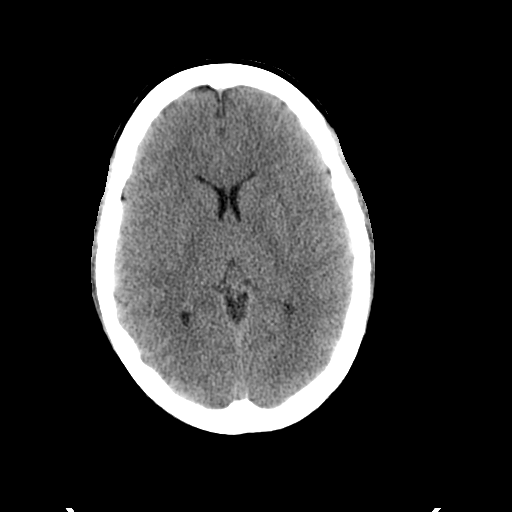
[im 15/36  brain]
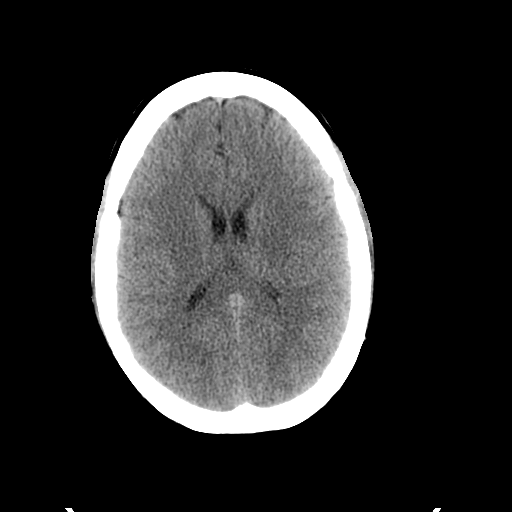
[im 17/36  brain]
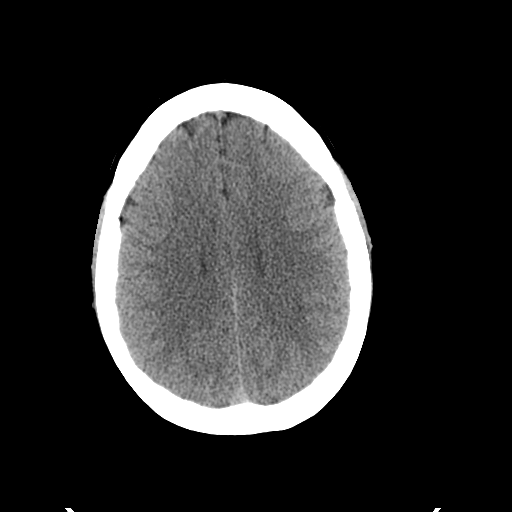
[im 19/36  brain]
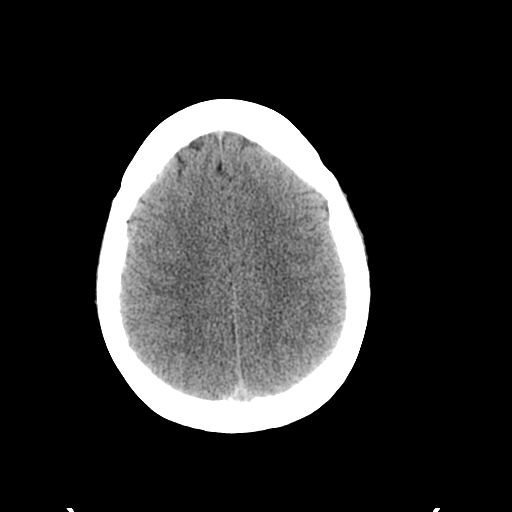
[im 19/36  bone]
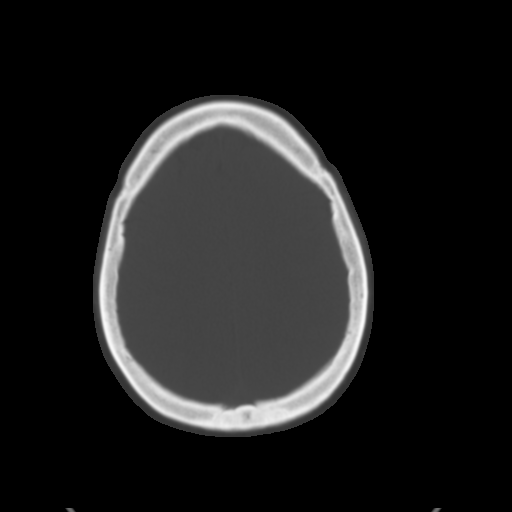
[im 21/36  brain]
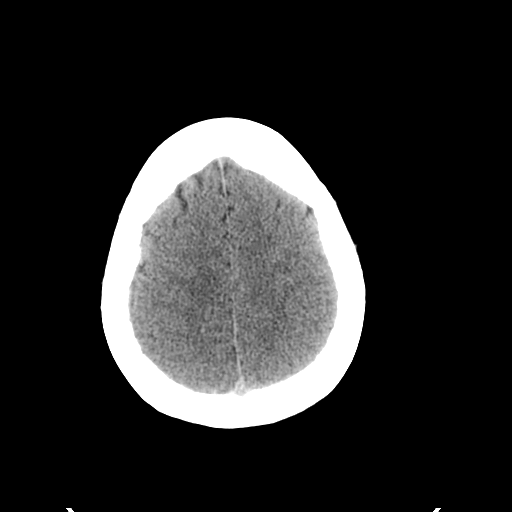
[im 23/36  brain]
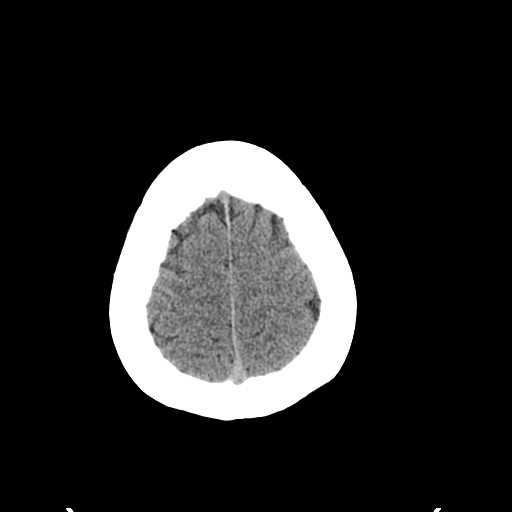
[im 26/36  brain]
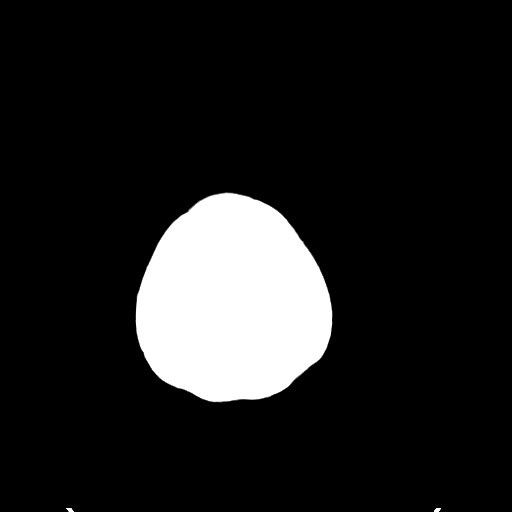
[im 27/36  brain]
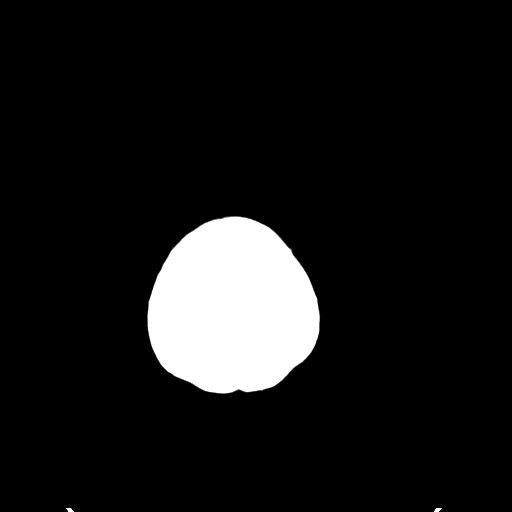
[im 27/36  bone]
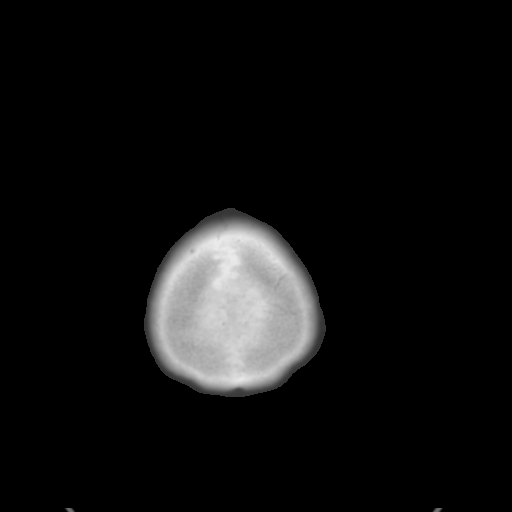
[im 29/36  brain]
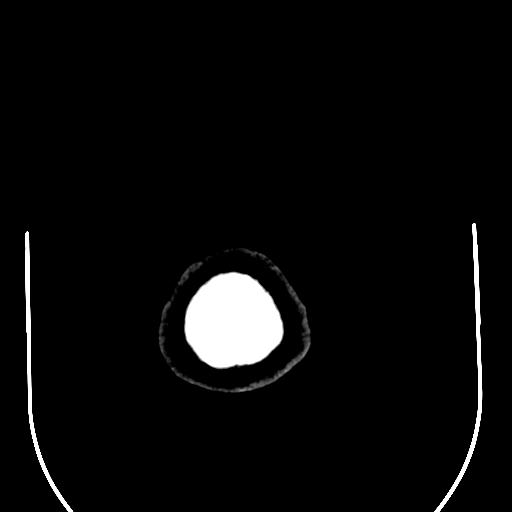
[im 32/36  brain]
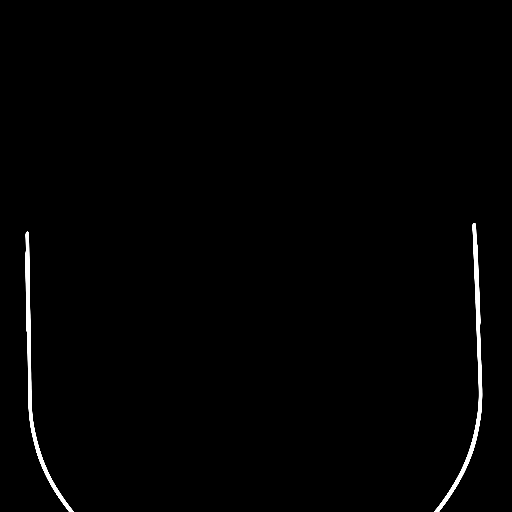
[im 34/36  brain]
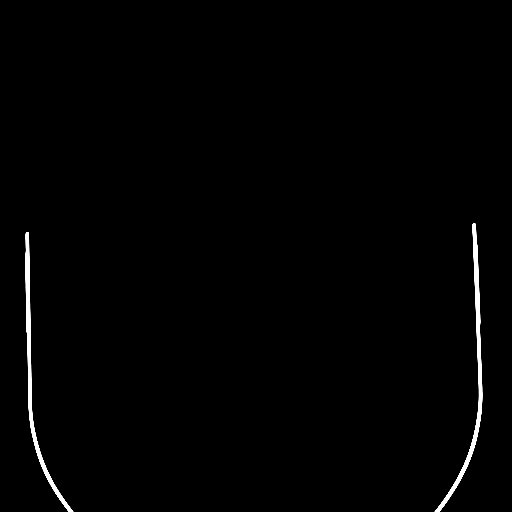

[16 of 30 positions shown; findings below may reference images not displayed]

FINDINGS: Brain: No evidence of acute infarction, hemorrhage, extra-axial
collection, ventriculomegaly, or mass effect.

Vascular: No hyperdense vessel or unexpected calcification.

Skull: Negative for fracture or focal lesion.

Sinuses/Orbits: No acute findings.

Other: None.
IMPRESSION: Negative noncontrast head CT.

## 2017-08-01 DIAGNOSIS — N760 Acute vaginitis: Secondary | ICD-10-CM | POA: Diagnosis not present

## 2017-10-26 DIAGNOSIS — E669 Obesity, unspecified: Secondary | ICD-10-CM | POA: Diagnosis not present

## 2017-10-26 DIAGNOSIS — Z713 Dietary counseling and surveillance: Secondary | ICD-10-CM | POA: Diagnosis not present

## 2018-04-23 ENCOUNTER — Emergency Department (HOSPITAL_COMMUNITY)
Admission: EM | Admit: 2018-04-23 | Discharge: 2018-04-23 | Disposition: A | Discharge status: Home / Self Care | Payer: BLUE CROSS/BLUE SHIELD | Attending: Emergency Medicine | Admitting: Emergency Medicine

## 2018-04-23 ENCOUNTER — Encounter (HOSPITAL_COMMUNITY): Payer: Self-pay

## 2018-04-23 ENCOUNTER — Other Ambulatory Visit: Payer: Self-pay

## 2018-04-23 ENCOUNTER — Emergency Department (HOSPITAL_COMMUNITY): Payer: BLUE CROSS/BLUE SHIELD

## 2018-04-23 DIAGNOSIS — R0789 Other chest pain: Secondary | ICD-10-CM | POA: Diagnosis not present

## 2018-04-23 DIAGNOSIS — R002 Palpitations: Secondary | ICD-10-CM | POA: Insufficient documentation

## 2018-04-23 DIAGNOSIS — R079 Chest pain, unspecified: Secondary | ICD-10-CM | POA: Diagnosis not present

## 2018-04-23 DIAGNOSIS — R0602 Shortness of breath: Secondary | ICD-10-CM | POA: Diagnosis not present

## 2018-04-23 LAB — I-STAT BETA HCG BLOOD, ED (MC, WL, AP ONLY): I-stat hCG, quantitative: <5 m[IU]/mL (ref <5)

## 2018-04-23 LAB — BASIC METABOLIC PANEL
Anion gap: 8 (ref 5–15)
BUN: 10 mg/dL (ref 6–20)
CHLORIDE: 104 mmol/L (ref 98–111)
CO2: 27 mmol/L (ref 22–32)
CREATININE: 0.72 mg/dL (ref 0.44–1.00)
Calcium: 9.3 mg/dL (ref 8.9–10.3)
GFR calc non Af Amer: >60 mL/min (ref >60)
Glucose, Bld: 90 mg/dL (ref 70–99)
Potassium: 3.9 mmol/L (ref 3.5–5.1)
Sodium: 139 mmol/L (ref 135–145)

## 2018-04-23 LAB — I-STAT TROPONIN, ED: Troponin i, poc: 0 ng/mL (ref 0.00–0.08)

## 2018-04-23 LAB — TSH: TSH: 2.568 u[IU]/mL (ref 0.350–4.500)

## 2018-04-23 LAB — CBC
HCT: 40.2 % (ref 36.0–46.0)
HEMOGLOBIN: 12.5 g/dL (ref 12.0–15.0)
MCH: 26 pg (ref 26.0–34.0)
MCHC: 31.1 g/dL (ref 30.0–36.0)
MCV: 83.6 fL (ref 78.0–100.0)
PLATELETS: 231 10*3/uL (ref 150–400)
RBC: 4.81 MIL/uL (ref 3.87–5.11)
RDW: 14.4 % (ref 11.5–15.5)
WBC: 7.8 10*3/uL (ref 4.0–10.5)

## 2018-04-23 MED ORDER — HYDROXYZINE HCL 25 MG PO TABS
25.0000 mg | ORAL_TABLET | Freq: Once | ORAL | Status: AC
  Administered 2018-04-23: 25 mg via ORAL
  Filled 2018-04-23: qty 1

## 2018-04-23 NOTE — ED Provider Notes (Signed)
[facility]   CSN: [id number] Arrival date & time: 04/23/18  1804     History   Chief Complaint Chief Complaint  Patient presents with  . Palpitations  . Anxiety    HPI Carol [name] is a 27 y.o. female.  27 year old female presents with complaint of palpitations and headache.  Patient states that every day for the past month she has a feeling in her chest like her heart is racing.  Patient states that yesterday this feeling was so intense that she had to pull her car over, had some left arm discomfort.  Previous history includes pseudotumor cerebri, states that her headache is not as severe as her regular headaches.  Denies nausea, vomiting, changes in bowel or bladder habits, shortness of breath.  Patient has not been seen in the past month for this episode however states she has been seen previously without formal diagnosis made.  No other complaints or concerns.     Past Medical History:  Diagnosis Date  . 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 Panic attacks   . Pseudotumor cerebri     Patient Active Problem List   Diagnosis Date Noted  . Depression 10/11/2013  . Headache(784.0) 06/25/2013    Past Surgical History:  Procedure Laterality Date  . addenoidectomy    . LUMBAR PUNCTURE    . PYLOROMYOTOMY    . TONSILLECTOMY       OB History    Gravida  2   Para  2   Term  2   Preterm  0   AB  0   Living  2     SAB  0   TAB  0   Ectopic  0   Multiple  0   Live Births  2            Home Medications    Prior to Admission medications   Medication Sig Start Date End Date Taking? Authorizing Provider  naproxen (NAPROSYN) 500 MG tablet Take 1 tablet (500 mg total) by mouth 2 (two) times daily. Patient not taking: Reported on 04/23/2018 05/18/17   Gilda CreasePollina, Christopher J, MD  acetaZOLAMIDE (DIAMOX) 125 MG tablet Take 2  tablets (250 mg total) by mouth 2 (two) times daily. 06/26/13 10/09/15  Minta Balsamdom, Michael R, MD    Family History Family History  Problem Relation Age of Onset  . Asthma Father   . Asthma Sister   . Mental illness Sister   . Diabetes Maternal Grandmother   . Cancer Paternal Grandmother   . Diabetes Paternal Grandfather     Social History Social History   Tobacco Use  . Smoking status: Never Smoker  . Smokeless tobacco: Never Used  Substance Use Topics  . Alcohol use: No  . Drug use: No     Allergies   Penicillins and Minocycline   Review of Systems Review of Systems  Constitutional: Negative for chills and fever.  Respiratory: Positive for chest tightness. Negative for shortness of breath.   Cardiovascular: Positive for palpitations. Negative for chest pain and leg swelling.  Gastrointestinal: Negative for abdominal pain, constipation, diarrhea, nausea and vomiting.  Musculoskeletal: Negative for back pain.  Skin: Negative for rash and wound.  Allergic/Immunologic: Negative for immunocompromised state.  Neurological: Positive for headaches.  Hematological: Does not bruise/bleed easily.  Psychiatric/Behavioral: Negative for confusion.  All other systems reviewed and are  negative.    Physical Exam Updated Vital Signs BP 123/88   Pulse 71   Temp 98.2 F (36.8 C) (Oral)   Resp 18   Ht 4\' 11"  (1.499 m)   Wt 83.9 kg (185 lb)   LMP 04/23/2018   SpO2 100%   BMI 37.37 kg/m   Physical Exam  Constitutional: She is oriented to person, place, and time. She appears well-developed and well-nourished. No distress.  HENT:  Head: Normocephalic and atraumatic.  Eyes: Pupils are equal, round, and reactive to light. Conjunctivae and EOM are normal.  Cardiovascular: Normal rate, regular rhythm, normal heart sounds and intact distal pulses. Exam reveals no gallop and no friction rub.  No murmur heard. Pulmonary/Chest: Effort normal and breath sounds normal. No respiratory  distress. She exhibits no tenderness.  Abdominal: Soft. She exhibits no distension. There is no tenderness.  Musculoskeletal: She exhibits no tenderness.  Neurological: She is alert and oriented to person, place, and time. No sensory deficit.  Skin: Skin is warm and dry. No rash noted. She is not diaphoretic.  Psychiatric: She has a normal mood and affect. Her behavior is normal.  Nursing note and vitals reviewed.    ED Treatments / Results  Labs (all labs ordered are listed, but only abnormal results are displayed) Labs Reviewed  BASIC METABOLIC PANEL  CBC  TSH  I-STAT TROPONIN, ED  I-STAT BETA HCG BLOOD, ED ([facility], [facility], [facility] ONLY)    EKG None  Radiology Dg Chest 2 View  Result Date: 04/23/2018 CLINICAL DATA:  LEFT-sided chest pain. Shortness of breath and nausea. EXAM: CHEST - 2 VIEW COMPARISON:  05/17/2017. FINDINGS: The heart size and mediastinal contours are within normal limits. Both lungs are clear. The visualized skeletal structures are unremarkable. IMPRESSION: No active cardiopulmonary disease. Electronically Signed   By: Elsie Stain M.D.   On: 04/23/2018 19:12    Procedures Procedures (including critical care time)  Medications Ordered in ED Medications  hydrOXYzine (ATARAX/VISTARIL) tablet 25 mg (25 mg Oral Given 04/23/18 2149)     Initial Impression / Assessment and Plan / ED Course  I have reviewed the triage vital signs and the nursing notes.  Pertinent labs & imaging results that were available during my care of the patient were reviewed by me and considered in my medical decision making (see chart for details).  Clinical Course as of Apr 23 2209  Mon Apr 23, 2018  855 27 year old female presents with complaint of palpitations daily for the past month.  Patient has not had any further events while she has been on the monitor in the emergency room.  Her EKG is unremarkable.  Lab work including a BNP, CBC, troponin are all normal, pregnancy test negative.  TSH  pending.  Advised patient she needs follow-up with PCP or cardiology to discuss event monitoring and further work-up.  Patient was given Vistaril while in the emergency room for her symptoms.   [LM]  2209 Pending TSH, if normal, patient may be dc to follow up with PCP to discuss event monitor.    [LM]    Clinical Course User Index [LM] Jeannie Fend, PA-C   Final Clinical Impressions(s) / ED Diagnoses   Final diagnoses:  Palpitations    ED Discharge Orders    None       Jeannie Fend, PA-C 04/23/18 2210    Sabas Sous, MD 04/24/18 [phone]

## 2018-04-23 NOTE — ED Triage Notes (Signed)
Patient from home for palpitations for a month and now having left arm pain that began yesterday. Also states had a panic attack yesterday while driving to the point where she had to pull over.

## 2018-04-23 NOTE — ED Notes (Signed)
Pt ambulated back to room. Currently in restroom.

## 2018-04-23 NOTE — ED Notes (Signed)
Patient verbalizes understanding of discharge instructions. Opportunity for questioning and answers were provided. Armband removed by staff, pt discharged from ED ambulatory.   

## 2018-04-23 NOTE — Discharge Instructions (Signed)
Follow up with primary care for cardiac event monitor, return to ER for worsening or concerning symptoms.

## 2018-04-23 NOTE — ED Provider Notes (Signed)
Patient placed in Quick Look pathway, seen and evaluated   Chief Complaint: Palpitations, left arm pain  HPI:   Palpitations intermittently for the last month, yesterday she had an episode of the moral that she was driving and then began to have intense left arm pain, arm pain has since improved but she has some paresthesias over the left arm.  Reports she also had a panic attack yesterday after this started and had to pull over  ROS: + Palpitations, arm pain, anxiety. -Chest pain, shortness of breath, syncope  Physical Exam:   Gen: No distress  Neuro: Awake and Alert  Skin: Warm    Focused Exam: Heart with regular rate and rhythm, lungs clear to auscultation bilaterally   Initiation of care has begun. The patient has been counseled on the process, plan, and necessity for staying for the completion/evaluation, and the remainder of the medical screening examination   Legrand RamsFord, Delicia Berens N, PA-C 04/23/18 Latanya [id number]    Campos, Kevin, MD 04/23/18 [phone]

## 2018-04-26 ENCOUNTER — Other Ambulatory Visit: Payer: Self-pay

## 2018-04-26 ENCOUNTER — Ambulatory Visit (INDEPENDENT_AMBULATORY_CARE_PROVIDER_SITE_OTHER): Payer: BLUE CROSS/BLUE SHIELD | Admitting: Physician Assistant

## 2018-04-26 ENCOUNTER — Encounter (INDEPENDENT_AMBULATORY_CARE_PROVIDER_SITE_OTHER): Payer: Self-pay | Admitting: Physician Assistant

## 2018-04-26 VITALS — BP 125/84 | HR 65 | Temp 98.3°F | Ht 59.0 in | Wt 178.8 lb

## 2018-04-26 DIAGNOSIS — G932 Benign intracranial hypertension: Secondary | ICD-10-CM | POA: Diagnosis not present

## 2018-04-26 DIAGNOSIS — R002 Palpitations: Secondary | ICD-10-CM

## 2018-04-26 DIAGNOSIS — F410 Panic disorder [episodic paroxysmal anxiety] without agoraphobia: Secondary | ICD-10-CM | POA: Diagnosis not present

## 2018-04-26 MED ORDER — ESCITALOPRAM OXALATE 10 MG PO TABS: 10.0000 mg | ORAL_TABLET | Freq: Every day | ORAL | 2 refills | Status: DC

## 2018-04-26 MED ORDER — CLONAZEPAM 0.5 MG PO TABS: 0.5000 mg | ORAL_TABLET | Freq: Every day | ORAL | 0 refills | Status: DC

## 2018-04-26 MED ORDER — ACETAZOLAMIDE 250 MG PO TABS: 500.0000 mg | ORAL_TABLET | Freq: Two times a day (BID) | ORAL | 5 refills | Status: DC

## 2018-04-26 NOTE — Patient Instructions (Addendum)
Panic Attack A panic attack is a sudden episode of severe anxiety, fear, or discomfort that causes physical and emotional symptoms. The attack may be in response to something frightening, or it may occur for no known reason. Symptoms of a panic attack can be similar to symptoms of a heart attack or stroke. It is important to see your health care provider when you have a panic attack so that these conditions can be ruled out. A panic attack is a symptom of another condition. Most panic attacks go away with treatment of the underlying problem. If you have panic attacks often, you may have a condition called panic disorder. What are the causes? A panic attack may be caused by:  An extreme, life-threatening situation, such as a war or natural disaster.  An anxiety disorder, such as post-traumatic stress disorder.  Depression.  Certain medical conditions, including heart problems, neurological conditions, and infections.  Certain over-the-counter and prescription medicines.  Illegal drugs that increase heart rate and blood pressure, such as methamphetamine.  Alcohol.  Supplements that increase anxiety.  Panic disorder.  What increases the risk? You are more likely to develop this condition if:  You have an anxiety disorder.  You have another mental health condition.  You take certain medicines.  You use alcohol, illegal drugs, or other substances.  You are under extreme stress.  A life event is causing increased feelings of anxiety and depression.  What are the signs or symptoms? A panic attack starts suddenly, usually lasts about 20 minutes, and occurs with one or more of the following:  A pounding heart.  A feeling that your heart is beating irregularly or faster than normal (palpitations).  Sweating.  Trembling or shaking.  Shortness of breath or feeling smothered.  Feeling choked.  Chest pain or discomfort.  Nausea or a strange feeling in your  stomach.  Dizziness, feeling lightheaded, or feeling like you might faint.  Chills or hot flashes.  Numbness or tingling in your lips, hands, or feet.  Feeling confused, or feeling that you are not yourself.  Fear of losing control or being emotionally unstable.  Fear of dying.  How is this diagnosed? A panic attack is diagnosed with an assessment by your health care provider. During the assessment your health care provider will ask questions about:  Your history of anxiety, depression, and panic attacks.  Your medical history.  Whether you drink alcohol, use illegal drugs, take supplements, or take medicines. Be honest about your substance use.  Your health care provider may also:  Order blood tests or other kinds of tests to rule out serious medical conditions.  Refer you to a mental health professional for further evaluation.  How is this treated? Treatment depends on the cause of the panic attack:  If the cause is a medical problem, your health care provider will either treat that problem or refer you to a specialist.  If the cause is emotional, you may be given anti-anxiety medicines or referred to a counselor. These medicines may reduce how often attacks happen, reduce how severe the attacks are, and lower anxiety.  If the cause is a medicine, your health care provider may tell you to stop the medicine, change your dose, or take a different medicine.  If the cause is a drug, treatment may involve letting the drug wear off and taking medicine to help the drug leave your body or to counteract its effects. Attacks caused by drug abuse may continue even if you stop using   the drug.  Follow these instructions at home:  Take over-the-counter and prescription medicines only as told by your health care provider.  If you feel anxious, limit your caffeine intake.  Take good care of your physical and mental health by: ? Eating a balanced diet that includes plenty of fresh  fruits and vegetables, whole grains, lean meats, and low-fat dairy. ? Getting plenty of rest. Try to get 7-8 hours of uninterrupted sleep each night. ? Exercising regularly. Try to get 30 minutes of physical activity at least 5 days a week. ? Not smoking. Talk to your health care provider if you need help quitting. ? Limiting alcohol intake to no more than 1 drink a day for nonpregnant women and 2 drinks a day for men. One drink equals 12 oz of beer, 5 oz of wine, or 1 oz of hard liquor.  Keep all follow-up visits as told by your health care provider. This is important. Panic attacks may have underlying physical or emotional problems that take time to accurately diagnose. Contact a health care provider if:  Your symptoms do not improve, or they get worse.  You are not able to take your medicine as prescribed because of side effects. Get help right away if:  You have serious thoughts about hurting yourself or others.  You have symptoms of a panic attack. Do not drive yourself to the hospital. Have someone else drive you or call an ambulance. If you ever feel like you may hurt yourself or others, or you have thoughts about taking your own life, get help right away. You can go to your nearest emergency department or call:  Your local emergency services (911 in the [place]  A suicide crisis helpline, such as the National Suicide Prevention Lifeline at 1-800-273-8255. This is open 24 hours a day.  Summary  A panic attack is a sign of a serious health or mental health condition. Get help right away. Do not drive yourself to the hospital. Have someone else drive you or call an ambulance.  Always see a health care provider to have the reasons for the panic attack correctly diagnosed.  If your panic attack was caused by a physical problem, follow your health care provider's suggestions for medicine, referral to a specialist, and lifestyle changes.  If your panic attack was caused by an  emotional problem, follow through with counseling from a qualified mental health specialist.  If you feel like you may hurt yourself or others, call 911 and get help right away. This information is not intended to replace advice given to you by your health care provider. Make sure you discuss any questions you have with your health care provider. Document Released: 09/12/2005 Document Revised: 10/21/2016 Document Reviewed: 10/21/2016 Elsevier Interactive Patient Education  2018 [employer]  

## 2018-04-26 NOTE — Progress Notes (Signed)
Subjective:  Patient ID: Carol [name], female    DOB: 12/25/1990  Age: 27 y.o. MRN: [id number]  CC: hospital f/u palpitations  HPI Carol [name] is a 27 y.o. female with a medical history of panic attacks, pseudotumor cerebri, and necrotizing fasciitis due to streptococcus pyogenes presents as a new patient on hospital f/u for palpitations. ED visit 3 days ago.  EKG was unremarkable.  Lab work including a BNP, CBC, troponin, TSH, and pregnancy test were all normal/negative. Pt states she has been having palpitations since one month ago. Had a similar episode two years ago. There is associated tremulousness, feeling of weakness, and choking sensation when palpitations happen. PHQ9 10 and GAD7 5. Has not taken anything for anxiety or seen a psychiatrist.     In regards to pseudotumor cerebri, patient has not taken her "diuretic" (likely referring to Diamox) for possibly a couple of years. Lost insurance and could not continue seeing neurology. However, she has health insurance coverage again and willing to see neurologist. Does not endorse headache or eye pain.       Outpatient Medications Prior to Visit  Medication Sig Dispense Refill  . naproxen (NAPROSYN) 500 MG tablet Take 1 tablet (500 mg total) by mouth 2 (two) times daily. (Patient not taking: Reported on 04/23/2018) 15 tablet 0   No facility-administered medications prior to visit.      ROS Review of Systems  Constitutional: Negative for chills, fever and malaise/fatigue.  Eyes: Negative for blurred vision.  Respiratory: Negative for shortness of breath.   Cardiovascular: Positive for palpitations. Negative for chest pain.  Gastrointestinal: Negative for abdominal pain and nausea.  Genitourinary: Negative for dysuria and hematuria.  Musculoskeletal: Negative for joint pain and myalgias.  Skin: Negative for rash.  Neurological: Negative for tingling and headaches.       Tremulousness   Psychiatric/Behavioral: Negative for depression. The patient is not nervous/anxious.        Choking sensation    Objective:  BP 125/84 (BP Location: Left Arm, Patient Position: Sitting, Cuff Size: Normal)   Pulse 65   Temp 98.3 F (36.8 C) (Oral)   Ht 4\' 11"  (1.499 m)   Wt 178 lb 12.8 oz (81.1 kg)   LMP 04/23/2018   SpO2 100%   BMI 36.11 kg/m   BP/Weight 04/26/2018 04/23/2018 05/18/2017  Systolic BP 125 105 104  Diastolic BP 84 77 66  Wt. (Lbs) 178.8 185 -  BMI 36.11 37.37 42.41      Physical Exam  Constitutional: She is oriented to person, place, and time.  Well developed, obese, NAD, polite  HENT:  Head: Normocephalic and atraumatic.  Eyes: No scleral icterus.  Neck: Normal range of motion. Neck supple. No thyromegaly present.  Cardiovascular: Normal rate, regular rhythm and normal heart sounds.  Pulmonary/Chest: Effort normal and breath sounds normal.  Musculoskeletal: She exhibits no edema.  Neurological: She is alert and oriented to person, place, and time.  Skin: Skin is warm and dry. No rash noted. No erythema. No pallor.  Psychiatric: Her behavior is normal. Thought content normal.  anxious  Vitals reviewed.    Assessment & Plan:    1. Palpitations - Urgent Ambulatory referral to Cardiology for loop recorder - Normal labs and EKG at ED three days ago.  2. Panic attacks - Begin escitalopram (LEXAPRO) 10 MG tablet; Take 1 tablet (10 mg total) by mouth daily.  Dispense: 30 tablet; Refill: 2 - Begin clonazePAM (KLONOPIN) 0.5 MG tablet; Take  1 tablet (0.5 mg total) by mouth at bedtime.  Dispense: 30 tablet; Refill: 0  3. Pseudotumor cerebri - Refill acetaZOLAMIDE (DIAMOX) 250 MG tablet; Take 2 tablets (500 mg total) by mouth 2 (two) times daily.  Dispense: 120 tablet; Refill: 5 - Ambulatory referral to Neurology   Meds ordered this encounter  Medications  . escitalopram (LEXAPRO) 10 MG tablet    Sig: Take 1 tablet (10 mg total) by mouth daily.     Dispense:  30 tablet    Refill:  2    Order Specific Question:   Supervising Provider    Answer:   Hoy Register [[id number]]  . clonazePAM (KLONOPIN) 0.5 MG tablet    Sig: Take 1 tablet (0.5 mg total) by mouth at bedtime.    Dispense:  30 tablet    Refill:  0    Order Specific Question:   Supervising Provider    Answer:   Hoy Register [[id number]]  . acetaZOLAMIDE (DIAMOX) 250 MG tablet    Sig: Take 2 tablets (500 mg total) by mouth 2 (two) times daily.    Dispense:  120 tablet    Refill:  5    Order Specific Question:   Supervising Provider    Answer:   Hoy Register [[id number]]    Follow-up: Return in about 1 month (around 05/24/2018) for Anxiety.   [name] PA

## 2018-05-01 ENCOUNTER — Ambulatory Visit (INDEPENDENT_AMBULATORY_CARE_PROVIDER_SITE_OTHER): Payer: BLUE CROSS/BLUE SHIELD | Admitting: Cardiovascular Disease

## 2018-05-01 ENCOUNTER — Encounter: Payer: Self-pay | Admitting: Cardiovascular Disease

## 2018-05-01 VITALS — BP 116/80 | HR 66 | Ht 59.0 in | Wt 177.2 lb

## 2018-05-01 DIAGNOSIS — R002 Palpitations: Secondary | ICD-10-CM | POA: Diagnosis not present

## 2018-05-01 NOTE — Progress Notes (Signed)
Cardiology Office Note   Date:  05/01/2018   ID:  Carol [name], DOB 07/29/1991, MRN [id number]  PCP:  Loletta SpecterGomez, Roger David, PA-C  Cardiologist:   Charlton HawsPeter Lauranne Beyersdorf, MD   No chief complaint on file.     History of Present Illness: Carol [name] is a 27 y.o. female who presents for consultation regarding palpitations Referred by Loletta Specteroger David Gomez PA-C Reviewed notes from 04/26/18 History of panic attacks, pseudotumor cerebri. Seen in ER 04/23/18 Labs ECG and telemetry were all normal. Palpitations for a month Associated with tremulousness weakness and choking sensation Has not had primary f/u due to lack of insurance but has it now She was started on Lexapro for panic attacks and dimox refilled for her Pseudotumor cerebri   Still having daily palpitations mostly flips/flops not sustained prolonged ones She works, going to school and has two young children    Past Medical History:  Diagnosis Date  . Depression 10/11/2013  . Headache(784.0) 06/25/2013  . Late prenatal care 03/06/2013  . Maternal group B streptococcal infection 05/27/2013   Treat in labor  >>  Allergic PCN, Resistant to Clinda  >> needs Vanc   . 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 Panic attacks   . Pseudotumor cerebri     Past Surgical History:  Procedure Laterality Date  . addenoidectomy    . LUMBAR PUNCTURE    . PYLOROMYOTOMY    . TONSILLECTOMY       Current Outpatient Medications  Medication Sig Dispense Refill  . clonazePAM (KLONOPIN) 0.5 MG tablet Take 1 tablet (0.5 mg total) by mouth at bedtime. 30 tablet 0  . escitalopram (LEXAPRO) 10 MG tablet Take 1 tablet (10 mg total) by mouth daily. 30 tablet 2   No current facility-administered medications for this visit.     Allergies:   Penicillins and Minocycline    Social History:  The patient  reports that she has never smoked. She has never used smokeless tobacco. She  reports that she does not drink alcohol or use drugs.   Family History:  The patient's family history includes Asthma in her father and sister; Cancer in her paternal grandmother; Diabetes in her maternal grandmother and paternal grandfather; Mental illness in her sister.    ROS:  Please see the history of present illness.   Otherwise, review of systems are positive for none.   All other systems are reviewed and negative.    PHYSICAL EXAM: VS:  BP 116/80   Pulse 66   Ht 4\' 11"  (1.499 m)   Wt 177 lb 4 oz (80.4 kg)   LMP 04/23/2018   SpO2 98%   BMI 35.80 kg/m  , BMI Body mass index is 35.8 kg/m. Affect appropriate Healthy:  appears stated age HEENT: normal Neck supple with no adenopathy JVP normal no bruits no thyromegaly Lungs clear with no wheezing and good diaphragmatic motion Heart:  S1/S2 no murmur, no rub, gallop or click PMI normal Abdomen: benighn, BS positve, no tenderness, no AAA no bruit.  No HSM or HJR Distal pulses intact with no bruits No edema Neuro non-focal Skin warm and dry No muscular weakness    EKG:  SR rate 93 nonspecific ST changes 04/24/18    Recent Labs: 04/23/2018: BUN 10; Creatinine, Ser 0.72; Hemoglobin 12.5; Platelets 231; Potassium 3.9; Sodium 139; TSH 2.568    Lipid Panel No results found for:  CHOL, TRIG, HDL, CHOLHDL, VLDL, LDLCALC, LDLDIRECT    Wt Readings from Last 3 Encounters:  05/01/18 177 lb 4 oz (80.4 kg)  04/26/18 178 lb 12.8 oz (81.1 kg)  04/23/18 185 lb (83.9 kg)      Other studies Reviewed: Additional studies/ records that were reviewed today include: Notes from primary labs and ECG .    ASSESSMENT AND PLAN:  1.  Palpitations:  Benign sounding since frequent will get event monitor 21 day 2. Panic Attacks started on Klonopin and Lexapro  3. Pseudo Tumor: having trouble affording diamox f/u primary see if there is substitute    Current medicines are reviewed at length with the patient today.  The patient does not  have concerns regarding medicines.  The following changes have been made:  no change  Labs/ tests ordered today include: Exercise myovue   Orders Placed This Encounter  Procedures  . CARDIAC EVENT MONITOR     Disposition:   FU with cardiology PRN      Signed, Charlton Haws, MD  05/01/2018 11:01 AM    [facility] [address] [place], [place], [place]  [address] Phone: [phone]; Fax: [phone]

## 2018-05-01 NOTE — Patient Instructions (Signed)
Medication Instructions:  Your physician recommends that you continue on your current medications as directed. Please refer to the Current Medication list given to you today.  Labwork: NONE  Testing/Procedures: Your physician has recommended that you wear an event monitor. Event monitors are medical devices that record the heart's electrical activity. Doctors most often us these monitors to diagnose arrhythmias. Arrhythmias are problems with the speed or rhythm of the heartbeat. The monitor is a small, portable device. You can wear one while you do your normal daily activities. This is usually used to diagnose what is causing palpitations/syncope (passing out).  Follow-Up: Your physician wants you to follow-up as neede with Dr. Eden EmmsNishan.  If you need a refill on your cardiac medications before your next appointment, please call your pharmacy.

## 2018-05-07 ENCOUNTER — Ambulatory Visit (INDEPENDENT_AMBULATORY_CARE_PROVIDER_SITE_OTHER): Payer: BLUE CROSS/BLUE SHIELD

## 2018-05-07 ENCOUNTER — Inpatient Hospital Stay: Payer: BLUE CROSS/BLUE SHIELD | Admitting: Family Medicine

## 2018-05-07 DIAGNOSIS — R002 Palpitations: Secondary | ICD-10-CM

## 2018-05-08 ENCOUNTER — Ambulatory Visit: Payer: BLUE CROSS/BLUE SHIELD | Admitting: Diagnostic Neuroimaging

## 2018-05-29 ENCOUNTER — Ambulatory Visit (INDEPENDENT_AMBULATORY_CARE_PROVIDER_SITE_OTHER): Payer: BLUE CROSS/BLUE SHIELD | Admitting: Physician Assistant

## 2018-07-04 ENCOUNTER — Encounter: Payer: Self-pay | Admitting: Diagnostic Neuroimaging

## 2018-07-04 ENCOUNTER — Ambulatory Visit (INDEPENDENT_AMBULATORY_CARE_PROVIDER_SITE_OTHER): Payer: BLUE CROSS/BLUE SHIELD | Admitting: Diagnostic Neuroimaging

## 2018-07-04 VITALS — BP 121/86 | HR 65 | Ht 59.0 in | Wt 184.0 lb

## 2018-07-04 DIAGNOSIS — G932 Benign intracranial hypertension: Secondary | ICD-10-CM

## 2018-07-04 DIAGNOSIS — G43009 Migraine without aura, not intractable, without status migrainosus: Secondary | ICD-10-CM | POA: Diagnosis not present

## 2018-07-04 MED ORDER — RIZATRIPTAN BENZOATE 10 MG PO TBDP: 10.0000 mg | ORAL_TABLET | ORAL | 11 refills | Status: DC | PRN

## 2018-07-04 MED ORDER — TOPIRAMATE 50 MG PO TABS: 50.0000 mg | ORAL_TABLET | Freq: Two times a day (BID) | ORAL | 12 refills | Status: DC

## 2018-07-04 NOTE — Progress Notes (Signed)
[facility]  PATIENT: Carol [name] ZOXWRUE DOB: [birth year]  REFERRING CLINICIAN: Dewaine Oats, PA HISTORY FROM: patient  REASON FOR VISIT: new consult    HISTORICAL  CHIEF COMPLAINT:  Chief Complaint  Patient presents with  . New Patient (Initial Visit)    Room 6. Pt is here alone.   . pseudotumor cerebri    Patient reports that she has daily mild headaches. Occasionally they can be so severe that they wake her from sleep.     HISTORY OF PRESENT ILLNESS:   27 year old female here for evaluation of pseudotumor cerebri.  2006, patient had increasing headache, sensitivity to sound, blurred vision, had evaluation and lumbar puncture with elevated opening pressure.  Patient was diagnosed with pseudotumor cerebri and prescribed acetazolamide.  Patient did well and then she tapered off medication.  One year later symptoms returned and patient had another lumbar puncture.  By 2013 patient was pregnant and therefore could not take any additional medication.  In 2017 she had lumbar puncture due to worsening symptoms, increased weight (260 pounds) with opening pressure of 53 cm water.  Symptoms improved as patient lost some weight.  2019 patient was doing better until August when she had increasing headaches.  Patient has tried to improve nutrition again and symptoms are stable.  Patient reports generalized pressure headaches with sensitivity to sound in the setting of increased pressure in the past.  Patient also has intermittent right-sided throbbing headaches with nausea, photophobia, blurred vision, lasting hours or days at a time.  These headaches can occur 3-4 times per week severe basis and 5 times per week on a mild basis.  Patient has family history of migraine in her aunt.  She was prescribed acetazolamide recently but could not afford due to excessive cost.   REVIEW OF SYSTEMS: Full 14 system review of systems performed and negative with exception of: Headache  numbness weakness sleepiness dizziness anxiety racing thoughts palpitations swelling legs blurred vision.   ALLERGIES: Allergies  Allergen Reactions  . Penicillins Anaphylaxis    Has patient had a PCN reaction causing immediate rash, facial/tongue/throat swelling, SOB or lightheadedness with hypotension: Yes Has patient had a PCN reaction causing severe rash involving mucus membranes or skin necrosis: No Has patient had a PCN reaction that required hospitalization No Has patient had a PCN reaction occurring within the last 10 years: No If all of the above answers are "NO", then may proceed with Cephalosporin use.   . Minocycline Other (See Comments)    Pseudo tumor cerebro    HOME MEDICATIONS: Outpatient Medications Prior to Visit  Medication Sig Dispense Refill  . escitalopram (LEXAPRO) 10 MG tablet Take 1 tablet (10 mg total) by mouth daily. 30 tablet 2  . clonazePAM (KLONOPIN) 0.5 MG tablet Take 1 tablet (0.5 mg total) by mouth at bedtime. 30 tablet 0   No facility-administered medications prior to visit.     PAST MEDICAL HISTORY: Past Medical History:  Diagnosis Date  . Depression 10/11/2013  . Headache(784.0) 06/25/2013  . Late prenatal care 03/06/2013  . Maternal group B streptococcal infection 05/27/2013   Treat in labor  >>  Allergic PCN, Resistant to Clinda  >> needs Vanc   . 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 Panic attacks   . Pseudotumor cerebri     PAST SURGICAL HISTORY: Past Surgical History:  Procedure Laterality Date  . addenoidectomy    .  LUMBAR PUNCTURE    . PYLOROMYOTOMY    . TONSILLECTOMY      FAMILY HISTORY: Family History  Problem Relation Age of Onset  . Asthma Father   . Asthma Sister   . Mental illness Sister   . Diabetes Maternal Grandmother   . Cancer Paternal Grandmother   . Diabetes Paternal Grandfather     SOCIAL HISTORY: Social History   Socioeconomic History  .  Marital status: Married    Spouse name: Not on file  . Number of children: Not on file  . Years of education: Not on file  . Highest education level: Not on file  Occupational History  . Not on file  Social Needs  . Financial resource strain: Not on file  . Food insecurity:    Worry: Not on file    Inability: Not on file  . Transportation needs:    Medical: Not on file    Non-medical: Not on file  Tobacco Use  . Smoking status: Never Smoker  . Smokeless tobacco: Never Used  Substance and Sexual Activity  . Alcohol use: No  . Drug use: No  . Sexual activity: Yes  Lifestyle  . Physical activity:    Days per week: Not on file    Minutes per session: Not on file  . Stress: Not on file  Relationships  . Social connections:    Talks on phone: Not on file    Gets together: Not on file    Attends religious service: Not on file    Active member of club or organization: Not on file    Attends meetings of clubs or organizations: Not on file    Relationship status: Not on file  . Intimate partner violence:    Fear of current or ex partner: Not on file    Emotionally abused: Not on file    Physically abused: Not on file    Forced sexual activity: Not on file  Other Topics Concern  . Not on file  Social History Narrative  . Not on file     PHYSICAL EXAM  GENERAL EXAM/CONSTITUTIONAL: Vitals:  Vitals:   07/04/18 1019  BP: 121/86  Pulse: 65  Weight: 184 lb (83.5 kg)  Height: 4\' 11"  (1.499 m)     Body mass index is 37.16 kg/m. Wt Readings from Last 8 Encounters:  07/04/18 184 lb (83.5 kg)  05/01/18 177 lb 4 oz (80.4 kg)  04/26/18 178 lb 12.8 oz (81.1 kg)  04/23/18 185 lb (83.9 kg)  05/17/17 210 lb (95.3 kg)  10/14/15 200 lb (90.7 kg)  10/10/15 210 lb (95.3 kg)  10/09/15 210 lb (95.3 kg)    Patient is in no distress; well developed, nourished and groomed; neck is supple  CARDIOVASCULAR:  Examination of carotid arteries is normal; no carotid bruits  Regular  rate and rhythm, no murmurs  Examination of peripheral vascular system by observation and palpation is normal  EYES:  Ophthalmoscopic exam of optic discs and posterior segments is normal; no papilledema or hemorrhages  Visual Acuity Screening   Right eye Left eye Both eyes  Without correction: 20/70 -1 20/30   With correction:        MUSCULOSKELETAL:  Gait, strength, tone, movements noted in Neurologic exam below  NEUROLOGIC: MENTAL STATUS:  No flowsheet data found.  awake, alert, oriented to person, place and time  recent and remote memory intact  normal attention and concentration  language fluent, comprehension intact, naming intact  fund of  knowledge appropriate  CRANIAL NERVE:   2nd - no papilledema on fundoscopic exam  2nd, 3rd, 4th, 6th - pupils equal and reactive to light, visual fields full to confrontation, extraocular muscles intact, no nystagmus  5th - facial sensation symmetric  7th - facial strength symmetric  8th - hearing intact  9th - palate elevates symmetrically, uvula midline  11th - shoulder shrug symmetric  12th - tongue protrusion midline  MOTOR:   normal bulk and tone, full strength in the BUE, BLE  SENSORY:   normal and symmetric to light touch, temperature, vibration  COORDINATION:   finger-nose-finger, fine finger movements normal  REFLEXES:   deep tendon reflexes present and symmetric  GAIT/STATION:   narrow based gait     DIAGNOSTIC DATA (LABS, IMAGING, TESTING) - I reviewed patient records, labs, notes, testing and imaging myself where available.  Lab Results  Component Value Date   WBC 7.8 04/23/2018   HGB 12.5 04/23/2018   HCT 40.2 04/23/2018   MCV 83.6 04/23/2018   PLT 231 04/23/2018      Component Value Date/Time   NA 139 04/23/2018 1827   NA 136 12/15/2012 1009   K 3.9 04/23/2018 1827   K 3.4 (L) 12/15/2012 1009   CL 104 04/23/2018 1827   CL 102 12/15/2012 1009   CO2 27 04/23/2018 1827    CO2 24 12/15/2012 1009   GLUCOSE 90 04/23/2018 1827   GLUCOSE 91 12/15/2012 1009   BUN 10 04/23/2018 1827   BUN 11 12/15/2012 1009   CREATININE 0.72 04/23/2018 1827   CREATININE 0.49 (L) 12/15/2012 1009   CALCIUM 9.3 04/23/2018 1827   CALCIUM 8.9 12/15/2012 1009   PROT 7.5 10/09/2015 0425   PROT 8.0 12/15/2012 1009   ALBUMIN 4.0 10/09/2015 0425   ALBUMIN 3.4 12/15/2012 1009   AST 32 10/09/2015 0425   AST 25 12/15/2012 1009   ALT 41 10/09/2015 0425   ALT 25 12/15/2012 1009   ALKPHOS 53 10/09/2015 0425   ALKPHOS 58 12/15/2012 1009   BILITOT 0.3 10/09/2015 0425   BILITOT 0.4 12/15/2012 1009   GFRNONAA >60 04/23/2018 1827   GFRNONAA >60 12/15/2012 1009   GFRAA >60 04/23/2018 1827   GFRAA >60 12/15/2012 1009   No results found for: CHOL, HDL, LDLCALC, LDLDIRECT, TRIG, CHOLHDL No results found for: WNUU7O No results found for: VITAMINB12 Lab Results  Component Value Date   TSH 2.568 04/23/2018    06/25/13 MRI brain [I reviewed images myself and agree with interpretation. -VRP]  - Physiologic prominence of the pituitary in the postpartum state. No evidence for apoplexy, subarachnoid hemorrhage, eclampsia, or other acute intracranial finding.  06/25/13 MRA head [I reviewed images myself and agree with interpretation. -VRP]  - unremarkable  06/25/18 MRV head [I reviewed images myself and agree with interpretation. -VRP]  - No evidence of venous sinus thrombosis. Normal variant hypoplastic proximal left transverse sinus.   ASSESSMENT AND PLAN  27 y.o. year old female here with history of pseudotumor cerebri, also with headaches with migraine features.  Dx:  1. IIH (idiopathic intracranial hypertension)   2. Migraine without aura and without status migrainosus, not intractable     PLAN:  - ophthalmology evaluation (visual field testing; monitor for papilledema)  - start topiramate 50mg  at bedtime; after 1 week increase to twice a day; drink plenty of water  - rizatriptan  10mg  as needed for breakthrough headache; may repeat x 1 after 2 hours; max 2 tabs per day or 8 per month  Orders Placed This Encounter  Procedures  . Ambulatory referral to Ophthalmology   Meds ordered this encounter  Medications  . topiramate (TOPAMAX) 50 MG tablet    Sig: Take 1 tablet (50 mg total) by mouth 2 (two) times daily.    Dispense:  60 tablet    Refill:  12  . rizatriptan (MAXALT-MLT) 10 MG disintegrating tablet    Sig: Take 1 tablet (10 mg total) by mouth as needed for migraine. May repeat in 2 hours if needed    Dispense:  9 tablet    Refill:  11   Return in about 4 months (around 11/04/2018).    Suanne Marker, MD 07/04/2018, 10:49 AM Certified in Neurology, Neurophysiology and Neuroimaging  [facility] [address], [address] [place], [place] [address] [phone]

## 2018-07-04 NOTE — Patient Instructions (Signed)
-   ophthalmology evaluation (visual field testing; monitor for papilledema)  - start topiramate 50mg  at bedtime; after 1 week increase to twice a day; drink plenty of water  - rizatriptan 10mg  as needed for breakthrough headache; may repeat x 1 after 2 hours; max 2 tabs per day or 8 per month

## 2018-07-16 DIAGNOSIS — R51 Headache: Secondary | ICD-10-CM | POA: Diagnosis not present

## 2018-07-16 DIAGNOSIS — G932 Benign intracranial hypertension: Secondary | ICD-10-CM | POA: Diagnosis not present

## 2018-07-26 ENCOUNTER — Other Ambulatory Visit (INDEPENDENT_AMBULATORY_CARE_PROVIDER_SITE_OTHER): Payer: Self-pay | Admitting: Physician Assistant

## 2018-07-26 DIAGNOSIS — F410 Panic disorder [episodic paroxysmal anxiety] without agoraphobia: Secondary | ICD-10-CM

## 2018-07-27 NOTE — Telephone Encounter (Signed)
Left message informing patient she needs to call clinic and schedule an appointment before escitalopram can be refilled. Maryjean Morn, CMA

## 2018-07-27 NOTE — Telephone Encounter (Signed)
FWD to PCP. Tempestt S Roberts, CMA  

## 2018-08-02 DIAGNOSIS — L03312 Cellulitis of back [any part except buttock]: Secondary | ICD-10-CM | POA: Diagnosis not present

## 2018-08-02 DIAGNOSIS — J209 Acute bronchitis, unspecified: Secondary | ICD-10-CM | POA: Diagnosis not present

## 2018-11-06 ENCOUNTER — Ambulatory Visit: Payer: BLUE CROSS/BLUE SHIELD | Admitting: Diagnostic Neuroimaging

## 2018-11-06 ENCOUNTER — Telehealth: Payer: Self-pay | Admitting: *Deleted

## 2018-11-06 NOTE — Telephone Encounter (Signed)
Patient was no show for follow up today. 

## 2018-11-07 ENCOUNTER — Encounter: Payer: Self-pay | Admitting: Diagnostic Neuroimaging

## 2019-06-05 DIAGNOSIS — R2232 Localized swelling, mass and lump, left upper limb: Secondary | ICD-10-CM | POA: Diagnosis not present

## 2019-06-05 DIAGNOSIS — M79645 Pain in left finger(s): Secondary | ICD-10-CM | POA: Diagnosis not present

## 2019-06-05 DIAGNOSIS — M7989 Other specified soft tissue disorders: Secondary | ICD-10-CM | POA: Diagnosis not present

## 2019-06-05 DIAGNOSIS — Z880 Allergy status to penicillin: Secondary | ICD-10-CM | POA: Diagnosis not present

## 2019-06-05 DIAGNOSIS — L03012 Cellulitis of left finger: Secondary | ICD-10-CM | POA: Diagnosis not present

## 2019-06-05 DIAGNOSIS — L03011 Cellulitis of right finger: Secondary | ICD-10-CM | POA: Diagnosis not present

## 2020-08-18 ENCOUNTER — Emergency Department (HOSPITAL_COMMUNITY): Payer: No Typology Code available for payment source

## 2020-08-18 ENCOUNTER — Other Ambulatory Visit: Payer: Self-pay

## 2020-08-18 ENCOUNTER — Emergency Department (HOSPITAL_COMMUNITY)
Admission: EM | Admit: 2020-08-18 | Discharge: 2020-08-19 | Disposition: A | Discharge status: Home / Self Care | Payer: No Typology Code available for payment source | Attending: Emergency Medicine | Admitting: Emergency Medicine

## 2020-08-18 ENCOUNTER — Encounter (HOSPITAL_COMMUNITY): Payer: Self-pay | Admitting: Emergency Medicine

## 2020-08-18 DIAGNOSIS — R079 Chest pain, unspecified: Secondary | ICD-10-CM | POA: Insufficient documentation

## 2020-08-18 DIAGNOSIS — R519 Headache, unspecified: Secondary | ICD-10-CM | POA: Insufficient documentation

## 2020-08-18 DIAGNOSIS — R1084 Generalized abdominal pain: Secondary | ICD-10-CM | POA: Diagnosis not present

## 2020-08-18 LAB — I-STAT BETA HCG BLOOD, ED (MC, WL, AP ONLY): I-stat hCG, quantitative: <5 m[IU]/mL (ref <5)

## 2020-08-18 LAB — BASIC METABOLIC PANEL
Anion gap: 10 (ref 5–15)
BUN: 20 mg/dL (ref 6–20)
CO2: 27 mmol/L (ref 22–32)
Calcium: 9.6 mg/dL (ref 8.9–10.3)
Chloride: 101 mmol/L (ref 98–111)
Creatinine, Ser: 0.63 mg/dL (ref 0.44–1.00)
GFR, Estimated: >60 mL/min (ref >60)
Glucose, Bld: 105 mg/dL — ABNORMAL HIGH (ref 70–99)
Potassium: 3.9 mmol/L (ref 3.5–5.1)
Sodium: 138 mmol/L (ref 135–145)

## 2020-08-18 LAB — CBC
HCT: 35.9 % — ABNORMAL LOW (ref 36.0–46.0)
Hemoglobin: 10.4 g/dL — ABNORMAL LOW (ref 12.0–15.0)
MCH: 20.4 pg — ABNORMAL LOW (ref 26.0–34.0)
MCHC: 29 g/dL — ABNORMAL LOW (ref 30.0–36.0)
MCV: 70.4 fL — ABNORMAL LOW (ref 80.0–100.0)
Platelets: 303 10*3/uL (ref 150–400)
RBC: 5.1 MIL/uL (ref 3.87–5.11)
RDW: 18.6 % — ABNORMAL HIGH (ref 11.5–15.5)
WBC: 7.5 10*3/uL (ref 4.0–10.5)
nRBC: 0 % (ref 0.0–0.2)

## 2020-08-18 LAB — TROPONIN I (HIGH SENSITIVITY): Troponin I (High Sensitivity): 3 ng/L (ref <18)

## 2020-08-18 MED ORDER — DIPHENHYDRAMINE HCL 25 MG PO CAPS
25.0000 mg | ORAL_CAPSULE | Freq: Once | ORAL | Status: AC
  Administered 2020-08-18: 25 mg via ORAL
  Filled 2020-08-18: qty 1

## 2020-08-18 MED ORDER — PROCHLORPERAZINE EDISYLATE 10 MG/2ML IJ SOLN
10.0000 mg | Freq: Once | INTRAMUSCULAR | Status: DC
  Filled 2020-08-18: qty 2

## 2020-08-18 MED ORDER — PROCHLORPERAZINE MALEATE 5 MG PO TABS
10.0000 mg | ORAL_TABLET | Freq: Once | ORAL | Status: AC
  Administered 2020-08-18: 10 mg via ORAL
  Filled 2020-08-18: qty 2

## 2020-08-18 NOTE — ED Triage Notes (Signed)
Patient reports intermittent left chest pain radiating to left upper back onset last Saturday with SOB , no emesis or diaphoresis , denies cough or fever .

## 2020-08-19 LAB — HEPATIC FUNCTION PANEL
ALT: 19 U/L (ref 0–44)
AST: 19 U/L (ref 15–41)
Albumin: 4 g/dL (ref 3.5–5.0)
Alkaline Phosphatase: 39 U/L (ref 38–126)
Bilirubin, Direct: <0.1 mg/dL (ref 0.0–0.2)
Total Bilirubin: 0.3 mg/dL (ref 0.3–1.2)
Total Protein: 7 g/dL (ref 6.5–8.1)

## 2020-08-19 LAB — LIPASE, BLOOD: Lipase: 40 U/L (ref 11–51)

## 2020-08-19 LAB — TROPONIN I (HIGH SENSITIVITY): Troponin I (High Sensitivity): <2 ng/L (ref <18)

## 2020-08-19 MED ORDER — HYDROCODONE-ACETAMINOPHEN 5-325 MG PO TABS
1.0000 | ORAL_TABLET | Freq: Once | ORAL | Status: AC
  Administered 2020-08-19: 1 via ORAL
  Filled 2020-08-19: qty 1

## 2020-08-19 NOTE — ED Provider Notes (Signed)
[facility] EMERGENCY DEPARTMENT Provider Note   CSN: [id number] Arrival date & time: 08/18/20  2148     History Chief Complaint  Patient presents with  . Chest Pain    Carol [name] is a 29 y.o. female.  Patient to ED with multiple complaints. She reports abdominal pain for several days. She vomits when she attempts to eat or drink anything. No diarrhea but she reports her stool is soft and small in caliber. No fever. She started having pain in her chest located in the center, described as sharp, burning. No modifying factors. No cough or SOB. She is now having a headache that is bilateral. No visual change. The headache is constant. She reports history of pseudotumor cerebri remotely but has been asymptomatic since substantial weight loss. She reports being seen at [facility] 2 days ago for same symptoms, but at that time she describes episode of bilateral hand cramping, SOB, palpitations.   The history is provided by the patient. No language interpreter was used.       Past Medical History:  Diagnosis Date  . Depression 10/11/2013  . Headache(784.0) 06/25/2013  . Late prenatal care 03/06/2013  . Maternal group B streptococcal infection 05/27/2013   Treat in labor  >>  Allergic PCN, Resistant to Clinda  >> needs Vanc   . 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 Panic attacks   . Pseudotumor cerebri     Patient Active Problem List   Diagnosis Date Noted  . Depression 10/11/2013  . Headache(784.0) 06/25/2013    Past Surgical History:  Procedure Laterality Date  . addenoidectomy    . LUMBAR PUNCTURE    . PYLOROMYOTOMY    . TONSILLECTOMY       OB History    Gravida  2   Para  2   Term  2   Preterm  0   AB  0   Living  2     SAB  0   TAB  0   Ectopic  0   Multiple  0   Live Births  2           Family History  Problem Relation Age of Onset  .  Asthma Father   . Asthma Sister   . Mental illness Sister   . Diabetes Maternal Grandmother   . Cancer Paternal Grandmother   . Diabetes Paternal Grandfather     Social History   Tobacco Use  . Smoking status: Never Smoker  . Smokeless tobacco: Never Used  Substance Use Topics  . Alcohol use: No  . Drug use: No    Home Medications Prior to Admission medications   Not on File    Allergies    Penicillins and Minocycline  Review of Systems   Review of Systems  Constitutional: Negative for chills and fever.  HENT: Negative.   Respiratory: Negative.  Negative for cough and shortness of breath.   Cardiovascular: Positive for chest pain.  Gastrointestinal: Positive for abdominal pain, nausea and vomiting.  Genitourinary: Negative.   Musculoskeletal: Negative.   Skin: Negative.   Neurological: Positive for headaches.    Physical Exam Updated Vital Signs BP 109/67   Pulse 73   Temp 98 F (36.7 C) (Oral)   Resp 18   Ht 4\' 11"  (1.499 m)   Wt 72 kg   LMP 08/11/2020   SpO2 99%  BMI 32.06 kg/m   Physical Exam Vitals and nursing note reviewed.  Constitutional:      Appearance: She is well-developed.  HENT:     Head: Normocephalic and atraumatic.  Cardiovascular:     Rate and Rhythm: Normal rate and regular rhythm.     Heart sounds: No murmur heard.   Pulmonary:     Effort: Pulmonary effort is normal.     Breath sounds: Normal breath sounds. No wheezing, rhonchi or rales.  Chest:     Chest wall: No tenderness.  Abdominal:     General: Bowel sounds are normal.     Palpations: Abdomen is soft.     Tenderness: There is abdominal tenderness (Diffusely tendern soft abdomen). There is no guarding or rebound.  Musculoskeletal:        General: Normal range of motion.     Cervical back: Normal range of motion and neck supple.  Skin:    General: Skin is warm and dry.     Findings: No rash.  Neurological:     Mental Status: She is alert and oriented to person,  place, and time.     Comments: CN's 3-12 grossly intact. Speech is clear and focused. No facial asymmetry. No lateralizing weakness.No deficits of coordination. Ambulatory without imbalance.       ED Results / Procedures / Treatments   Labs (all labs ordered are listed, but only abnormal results are displayed) Labs Reviewed  BASIC METABOLIC PANEL - Abnormal; Notable for the following components:      Result Value   Glucose, Bld 105 (*)    All other components within normal limits  CBC - Abnormal; Notable for the following components:   Hemoglobin 10.4 (*)    HCT 35.9 (*)    MCV 70.4 (*)    MCH 20.4 (*)    MCHC 29.0 (*)    RDW 18.6 (*)    All other components within normal limits  HEPATIC FUNCTION PANEL  LIPASE, BLOOD  I-STAT BETA HCG BLOOD, ED ([facility], [facility], [facility] ONLY)  TROPONIN I (HIGH SENSITIVITY)  TROPONIN I (HIGH SENSITIVITY)    EKG None  Radiology DG Chest 2 View  Result Date: 08/18/2020 CLINICAL DATA:  Chest pain. EXAM: CHEST - 2 VIEW COMPARISON:  April 23, 2018 FINDINGS: The heart size and mediastinal contours are within normal limits. Both lungs are clear. The visualized skeletal structures are unremarkable. IMPRESSION: No active cardiopulmonary disease. Electronically Signed   By: Aram Candela M.D.   On: 08/18/2020 22:20    Procedures Procedures (including critical care time)  Medications Ordered in ED Medications  diphenhydrAMINE (BENADRYL) capsule 25 mg (25 mg Oral Given 08/18/20 2342)  prochlorperazine (COMPAZINE) tablet 10 mg (10 mg Oral Given 08/18/20 2342)  HYDROcodone-acetaminophen (NORCO/VICODIN) 5-325 MG per tablet 1 tablet (1 tablet Oral Given 08/19/20 0200)    ED Course  I have reviewed the triage vital signs and the nursing notes.  Pertinent labs & imaging results that were available during my care of the patient were reviewed by me and considered in my medical decision making (see chart for details).    MDM Rules/Calculators/A&P                           Patient to ED with ss/sxs as per HPI.   Well appearing patient. Appears comfortable in bed. C/o Headache pain. Labs pending. Will provide Compazine, benadryl for treatment of recurrent headache.   Labs are grossly reassuring.  No leukocytosis, electrolyte derangements, LFT elevations, normal lipase. Normal troponins. EKG nonischemic. CXR clear.   Discussed her work up in detail and she is reassured that no acute or life threatening process if self to be present. She reports her headache is no better. Will provide Norco in the ED for nonspecific headache but do not feel it represents stroke, bleed, cerebral edema or mass.   Strongly encouraged to become established with a primary care provider to continue the evaluation into her multiple complaints.   Final Clinical Impression(s) / ED Diagnoses Final diagnoses:  Nonspecific chest pain  Generalized abdominal pain  Acute nonintractable headache, unspecified headache type    Rx / DC Orders ED Discharge Orders    None       Elpidio Anis, PA-C 08/19/20 0630    Horton, Mayer Masker, MD 08/19/20 [phone]

## 2020-08-19 NOTE — Discharge Instructions (Addendum)
It is strongly recommended that you become established with a primary care physician for further outpatient management given multiple symptoms and essentially negative emergency department evaluation.   If you develop a high fever, severe pain, uncontrolled vomiting or new concern, please return to the ED for recheck.

## 2021-07-02 ENCOUNTER — Emergency Department: Payer: No Typology Code available for payment source

## 2021-07-02 ENCOUNTER — Emergency Department
Admission: EM | Admit: 2021-07-02 | Discharge: 2021-07-02 | Disposition: A | Discharge status: Home / Self Care | Payer: No Typology Code available for payment source | Attending: Emergency Medicine | Admitting: Emergency Medicine

## 2021-07-02 ENCOUNTER — Other Ambulatory Visit: Payer: Self-pay

## 2021-07-02 DIAGNOSIS — N939 Abnormal uterine and vaginal bleeding, unspecified: Secondary | ICD-10-CM | POA: Insufficient documentation

## 2021-07-02 DIAGNOSIS — N938 Other specified abnormal uterine and vaginal bleeding: Secondary | ICD-10-CM

## 2021-07-02 DIAGNOSIS — R1030 Lower abdominal pain, unspecified: Secondary | ICD-10-CM

## 2021-07-02 DIAGNOSIS — R102 Pelvic and perineal pain: Secondary | ICD-10-CM

## 2021-07-02 LAB — BASIC METABOLIC PANEL
Anion gap: 9 (ref 5–15)
BUN: 18 mg/dL (ref 6–20)
CO2: 27 mmol/L (ref 22–32)
Calcium: 9.1 mg/dL (ref 8.9–10.3)
Chloride: 101 mmol/L (ref 98–111)
Creatinine, Ser: 0.63 mg/dL (ref 0.44–1.00)
GFR, Estimated: >60 mL/min (ref >60)
Glucose, Bld: 106 mg/dL — ABNORMAL HIGH (ref 70–99)
Potassium: 3.7 mmol/L (ref 3.5–5.1)
Sodium: 137 mmol/L (ref 135–145)

## 2021-07-02 LAB — CBC
HCT: 41.3 % (ref 36.0–46.0)
Hemoglobin: 14.1 g/dL (ref 12.0–15.0)
MCH: 30.9 pg (ref 26.0–34.0)
MCHC: 34.1 g/dL (ref 30.0–36.0)
MCV: 90.6 fL (ref 80.0–100.0)
Platelets: 238 10*3/uL (ref 150–400)
RBC: 4.56 MIL/uL (ref 3.87–5.11)
RDW: 12.4 % (ref 11.5–15.5)
WBC: 7.1 10*3/uL (ref 4.0–10.5)
nRBC: 0 % (ref 0.0–0.2)

## 2021-07-02 LAB — PREGNANCY, URINE: Preg Test, Ur: NEGATIVE

## 2021-07-02 LAB — URINALYSIS, COMPLETE (UACMP) WITH MICROSCOPIC
Bacteria, UA: NONE SEEN
Bilirubin Urine: NEGATIVE
Glucose, UA: NEGATIVE mg/dL
Ketones, ur: NEGATIVE mg/dL
Leukocytes,Ua: NEGATIVE
Nitrite: NEGATIVE
Protein, ur: NEGATIVE mg/dL
Specific Gravity, Urine: 1.015 (ref 1.005–1.030)
pH: 6 (ref 5.0–8.0)

## 2021-07-02 LAB — POC URINE PREG, ED: Preg Test, Ur: NEGATIVE

## 2021-07-02 MED ORDER — NORGESTIMATE-ETH ESTRADIOL 0.25-35 MG-MCG PO TABS: 1.0000 | ORAL_TABLET | Freq: Every day | ORAL | 1 refills | Status: DC

## 2021-07-02 NOTE — ED Notes (Signed)
First Nurse Note:  Pt to ED from [facility] [place] for vaginal bleeding and weakness. Pt ambulatory, in NAD.

## 2021-07-02 NOTE — Discharge Instructions (Addendum)
As we discussed please begin taking your birth control pills as written.  Please call the number provided for OB/GYN to arrange a follow-up appointment.  Return to the emergency department for any significant increase in bleeding, abdominal/pelvic pain, or any other symptom personally concerning to yourself.

## 2021-07-02 NOTE — ED Notes (Signed)
Dc ppw provided. Pt assisted off unit with family on foot. No needs at this time

## 2021-07-02 NOTE — ED Provider Notes (Signed)
[facility] Emergency Department Provider Note  Time seen: 7:28 PM  I have reviewed the triage vital signs and the nursing notes.   HISTORY  Chief Complaint Vaginal Bleeding   HPI Carol [name] is a 30 y.o. female with a past medical history of depression, anxiety, presents to the emergency department for vaginal bleeding.  According to the patient over the past 2 months she has had 4 menstrual cycles lasting greater than 1 week each.  States currently she has been bleeding for nearly 2 weeks.  Patient denies any discharge, no trauma, no hormonal therapy.  Patient states she is feeling somewhat weak and dizzy today so she came to the emergency department for evaluation.  Has not yet followed up with OB/GYN.  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)    got from the chicken pox as a child    Panic attacks    Pseudotumor cerebri     Patient Active Problem List   Diagnosis Date Noted   Depression 10/11/2013   Headache(784.0) 06/25/2013    Past Surgical History:  Procedure Laterality Date   addenoidectomy     LUMBAR PUNCTURE     PYLOROMYOTOMY     TONSILLECTOMY      Prior to Admission medications   Not on File    Allergies  Allergen Reactions   Penicillins Anaphylaxis    Has patient had a PCN reaction causing immediate rash, facial/tongue/throat swelling, SOB or lightheadedness with hypotension: Yes Has patient had a PCN reaction causing severe rash involving mucus membranes or skin necrosis: No Has patient had a PCN reaction that required hospitalization No Has patient had a PCN reaction occurring within the last 10 years: No If all of the above answers are "NO", then may  proceed with Cephalosporin use.    Minocycline Other (See Comments)    Pseudo tumor cerebro    Family History  Problem Relation Age of Onset   Asthma Father    Asthma Sister    Mental illness Sister    Diabetes Maternal Grandmother    Cancer Paternal Grandmother    Diabetes Paternal Grandfather     Social History Social History   Tobacco Use   Smoking status: Never   Smokeless tobacco: Never  Substance Use Topics   Alcohol use: No   Drug use: No    Review of Systems Constitutional: Negative for fever. Cardiovascular: Negative for chest pain. Respiratory: Negative for shortness of breath. Gastrointestinal: Negative for abdominal pain Genitourinary: Vaginal bleeding Musculoskeletal: Negative for musculoskeletal complaints Neurological: Negative for headache All other ROS negative  ____________________________________________   PHYSICAL EXAM:  VITAL SIGNS: ED Triage Vitals  Enc Vitals Group     BP 07/02/21 1550 121/88     Pulse Rate 07/02/21 1550 79     Resp 07/02/21 1550 18     Temp 07/02/21 1550 98.6 F (37 C)     Temp Source 07/02/21 1550 Oral     SpO2 07/02/21 1550 100 %     Weight 07/02/21 1551 140 lb (63.5 kg)     Height 07/02/21 1551 4\' 11"  (1.499 m)     Head Circumference --      Peak Flow --  Pain Score 07/02/21 1550 6     Pain Loc --      Pain Edu? --      Excl. in GC? --    Constitutional: Alert and oriented. Well appearing and in no distress. Eyes: Normal exam ENT      Head: Normocephalic and atraumatic.      Mouth/Throat: Mucous membranes are moist. Cardiovascular: Normal rate, regular rhythm.  Respiratory: Normal respiratory effort without tachypnea nor retractions. Breath sounds are clear  Gastrointestinal: Soft and nontender. No distention.  Musculoskeletal: Nontender with normal range of motion in all extremities. Neurologic:  Normal speech and language. No gross focal neurologic deficits  Skin:  Skin is warm, dry and intact.   Psychiatric: Mood and affect are normal.  ____________________________________________    RADIOLOGY  Ultrasound pending  ____________________________________________   INITIAL IMPRESSION / ASSESSMENT AND PLAN / ED COURSE  Pertinent labs & imaging results that were available during my care of the patient were reviewed by me and considered in my medical decision making (see chart for details).   Patient presents emergency department for vaginal bleeding states it has been an intermittent and ongoing issue over the past 2 months.  Reassuringly patient's lab work is largely within normal limits, normal hemoglobin.  Nontender/benign abdominal exam.  Given the patient's ongoing bleeding we will obtain an ultrasound to further evaluate.  As long as the ultrasound does not show any concerning findings anticipate likely discharge home with oral contraceptives and OB follow-up.  Patient agreeable to plan of care.  [name] was evaluated in Emergency Department on 07/02/2021 for the symptoms described in the history of present illness. She was evaluated in the context of the global COVID-19 pandemic, which necessitated consideration that the patient might be at risk for infection with the SARS-CoV-2 virus that causes COVID-19. Institutional protocols and algorithms that pertain to the evaluation of patients at risk for COVID-19 are in a state of rapid change based on information released by regulatory bodies including the CDC and federal and state organizations. These policies and algorithms were followed during the patient's care in the ED.  ____________________________________________   FINAL CLINICAL IMPRESSION(S) / ED DIAGNOSES  Dysfunctional uterine bleeding   Minna Antis, MD 07/02/21 1931

## 2021-07-02 NOTE — ED Triage Notes (Signed)
Pt comes pov with vaginal bleeding. States 5th period like episode in a month. States since last night has been heavier bleeding than normal. Typically, periods are like clockwork and this is extremely out of her normal. Had sharp pain in her groin area about 2 months ago and unsure if that is unrelated. Also c/o rash on her right knee. Denies any changes in birth control or sexual activity.

## 2021-07-21 ENCOUNTER — Encounter: Payer: Self-pay | Admitting: Adult Health

## 2021-07-21 ENCOUNTER — Ambulatory Visit: Payer: No Typology Code available for payment source | Admitting: Adult Health

## 2021-07-21 ENCOUNTER — Other Ambulatory Visit: Payer: Self-pay

## 2021-07-21 VITALS — BP 118/84 | HR 69 | Ht 59.0 in | Wt 141.0 lb

## 2021-07-21 DIAGNOSIS — N939 Abnormal uterine and vaginal bleeding, unspecified: Secondary | ICD-10-CM | POA: Diagnosis not present

## 2021-07-21 DIAGNOSIS — R102 Pelvic and perineal pain: Secondary | ICD-10-CM | POA: Diagnosis not present

## 2021-07-21 DIAGNOSIS — F419 Anxiety disorder, unspecified: Secondary | ICD-10-CM | POA: Diagnosis not present

## 2021-07-21 DIAGNOSIS — F32A Depression, unspecified: Secondary | ICD-10-CM | POA: Diagnosis not present

## 2021-07-21 NOTE — Progress Notes (Signed)
  Subjective:     Patient ID: Carol [name], female   DOB: 05-10-1991, 30 y.o.   MRN: [id number]  HPI Carol [name] is a 30 year old white female, married, but separated, G2P2 in complaining of having 5 periods in 2 months with last one lasting 2 weeks from 9/26 to 07/06/21, and had pelvic pain esp in right, she had had COVID in early September. Recently moved here from [place]. She had normal pap 10/23/20 in [place] PCP is Isabella Stalling.  Review of Systems Had 5 periods in 3 months  +pelvic pain at times, esp right side +stress in life, recent separation and move  Reviewed past medical,surgical, social and family history. Reviewed medications and allergies.     Objective:   Physical Exam BP 118/84 (BP Location: Right Arm, Patient Position: Sitting, Cuff Size: Normal)   Pulse 69   Ht 4\' 11"  (1.499 m)   Wt 141 lb (64 kg)   LMP 06/21/2021 Comment: lasted till 10/11  BMI 28.48 kg/m  Skin warm and dry.Pelvic: external genitalia is normal in appearance no lesions, vagina: scant white discharge without odor,urethra has no lesions or masses noted, cervix:smooth and bulbous, uterus: normal size, shape and contour, mildly tender, no masses felt, adnexa: no masses + RLQ tenderness noted. Bladder is non tender and no masses felt.    AA is 0 Fall risk is low Depression screen [facility] 2/9 07/21/2021 04/26/2018  Decreased Interest 3 3  Down, Depressed, Hopeless 2 1  PHQ - 2 Score 5 4  Altered sleeping 3 2  Tired, decreased energy 3 3  Change in appetite 3 0  Feeling bad or failure about yourself  1 0  Trouble concentrating 3 0  Moving slowly or fidgety/restless 3 1  Suicidal thoughts 0 0  PHQ-9 Score 21 10    GAD 7 : Generalized Anxiety Score 07/21/2021 04/26/2018  Nervous, Anxious, on Edge 3 1  Control/stop worrying 3 1  Worry too much - different things 3 1  Trouble relaxing 3 1  Restless 2 0  Easily annoyed or irritable 2 0  Afraid - awful might happen 0 1  Total GAD 7 Score  16 5    Upstream - 07/21/21 1020       Pregnancy Intention Screening   Does the patient want to become pregnant in the next year? Unsure    Does the patient's partner want to become pregnant in the next year? Unsure    Would the patient like to discuss contraceptive options today? No      Contraception Wrap Up   Current Method Withdrawal or Other Method    End Method Withdrawal or Other Method    Contraception Counseling Provided No            Examination chaperoned by 07/23/21 CMA  Assessment:     1. Abnormal uterine bleeding (AUB) Will get pelvic Federico Flake 11/4//22 at 7:30 am at [facility]  2. Pelvic pain Will get pelvic [name]   3. Anxiety and depression She declines meds or counseling     Plan:    Follow up prn  Will talk when [place] results back, may consider OCs for a while

## 2021-07-30 ENCOUNTER — Ambulatory Visit (HOSPITAL_COMMUNITY): Admission: RE | Admit: 2021-07-30 | Payer: No Typology Code available for payment source | Source: Ambulatory Visit

## 2021-08-09 ENCOUNTER — Ambulatory Visit (HOSPITAL_COMMUNITY): Payer: No Typology Code available for payment source

## 2021-12-08 ENCOUNTER — Other Ambulatory Visit: Payer: Self-pay

## 2021-12-08 ENCOUNTER — Emergency Department (HOSPITAL_COMMUNITY): Payer: No Typology Code available for payment source

## 2021-12-08 ENCOUNTER — Encounter (HOSPITAL_COMMUNITY): Payer: Self-pay

## 2021-12-08 ENCOUNTER — Emergency Department (HOSPITAL_COMMUNITY)
Admission: EM | Admit: 2021-12-08 | Discharge: 2021-12-08 | Disposition: A | Discharge status: Home / Self Care | Payer: No Typology Code available for payment source | Attending: Emergency Medicine | Admitting: Emergency Medicine

## 2021-12-08 DIAGNOSIS — R202 Paresthesia of skin: Secondary | ICD-10-CM | POA: Insufficient documentation

## 2021-12-08 DIAGNOSIS — R079 Chest pain, unspecified: Secondary | ICD-10-CM | POA: Diagnosis present

## 2021-12-08 DIAGNOSIS — M79632 Pain in left forearm: Secondary | ICD-10-CM | POA: Diagnosis not present

## 2021-12-08 DIAGNOSIS — J189 Pneumonia, unspecified organism: Secondary | ICD-10-CM | POA: Diagnosis not present

## 2021-12-08 LAB — CBC WITH DIFFERENTIAL/PLATELET
Abs Immature Granulocytes: 0.01 10*3/uL (ref 0.00–0.07)
Basophils Absolute: 0 10*3/uL (ref 0.0–0.1)
Basophils Relative: 1 %
Eosinophils Absolute: 0.1 10*3/uL (ref 0.0–0.5)
Eosinophils Relative: 1 %
HCT: 43.4 % (ref 36.0–46.0)
Hemoglobin: 14.2 g/dL (ref 12.0–15.0)
Immature Granulocytes: 0 %
Lymphocytes Relative: 33 %
Lymphs Abs: 1.8 10*3/uL (ref 0.7–4.0)
MCH: 29.8 pg (ref 26.0–34.0)
MCHC: 32.7 g/dL (ref 30.0–36.0)
MCV: 91 fL (ref 80.0–100.0)
Monocytes Absolute: 0.5 10*3/uL (ref 0.1–1.0)
Monocytes Relative: 8 %
Neutro Abs: 3.2 10*3/uL (ref 1.7–7.7)
Neutrophils Relative %: 57 %
Platelets: 191 10*3/uL (ref 150–400)
RBC: 4.77 MIL/uL (ref 3.87–5.11)
RDW: 13.1 % (ref 11.5–15.5)
WBC: 5.6 10*3/uL (ref 4.0–10.5)
nRBC: 0 % (ref 0.0–0.2)

## 2021-12-08 LAB — BASIC METABOLIC PANEL
Anion gap: 9 (ref 5–15)
BUN: 15 mg/dL (ref 6–20)
CO2: 25 mmol/L (ref 22–32)
Calcium: 9 mg/dL (ref 8.9–10.3)
Chloride: 105 mmol/L (ref 98–111)
Creatinine, Ser: 0.59 mg/dL (ref 0.44–1.00)
GFR, Estimated: >60 mL/min (ref >60)
Glucose, Bld: 95 mg/dL (ref 70–99)
Potassium: 3.8 mmol/L (ref 3.5–5.1)
Sodium: 139 mmol/L (ref 135–145)

## 2021-12-08 LAB — PREGNANCY, URINE: Preg Test, Ur: NEGATIVE

## 2021-12-08 LAB — TROPONIN I (HIGH SENSITIVITY)
Troponin I (High Sensitivity): <2 ng/L (ref <18)
Troponin I (High Sensitivity): <2 ng/L (ref <18)

## 2021-12-08 LAB — URINALYSIS, ROUTINE W REFLEX MICROSCOPIC
Bilirubin Urine: NEGATIVE
Glucose, UA: NEGATIVE mg/dL
Hgb urine dipstick: NEGATIVE
Ketones, ur: 5 mg/dL — AB
Leukocytes,Ua: NEGATIVE
Nitrite: NEGATIVE
Protein, ur: NEGATIVE mg/dL
Specific Gravity, Urine: 1.01 (ref 1.005–1.030)
pH: 5 (ref 5.0–8.0)

## 2021-12-08 MED ORDER — METHOCARBAMOL 500 MG PO TABS
500.0000 mg | ORAL_TABLET | Freq: Once | ORAL | Status: AC
  Administered 2021-12-08: 500 mg via ORAL
  Filled 2021-12-08: qty 1

## 2021-12-08 MED ORDER — LEVOFLOXACIN 500 MG PO TABS
500.0000 mg | ORAL_TABLET | Freq: Once | ORAL | Status: AC
  Administered 2021-12-08: 500 mg via ORAL
  Filled 2021-12-08: qty 1

## 2021-12-08 MED ORDER — IBUPROFEN 800 MG PO TABS
800.0000 mg | ORAL_TABLET | Freq: Once | ORAL | Status: AC
  Administered 2021-12-08: 800 mg via ORAL
  Filled 2021-12-08: qty 1

## 2021-12-08 MED ORDER — LEVOFLOXACIN 500 MG PO TABS: 500.0000 mg | ORAL_TABLET | Freq: Every day | ORAL | 0 refills | Status: DC

## 2021-12-08 NOTE — ED Triage Notes (Signed)
Pt reports sharp pain in chest and between shoulder blades when she takes a deep breath.   Reports left forearm and hand numbness since 8pm.   ?

## 2021-12-08 NOTE — Discharge Instructions (Signed)
Take the antibiotic as directed until its finished.  As discussed, please follow-up with your primary care provider for recheck.  Return to the emergency department for any new or worsening symptoms. ?

## 2021-12-11 NOTE — ED Provider Notes (Signed)
?[facility] ?Provider Note ? ? ?CSN: [id number] ?Arrival date & time: 12/08/21  1049 ? ?  ? ?History ? ?No chief complaint on file. ? ? ?Carol [name] is a 31 y.o. female. ? ?HPI ? ?  ? ?Carol [name] is a 31 y.o. female who presents to the Emergency Department complaining of pain of her left forearm, tingling of left hand and discomfort of her left upper and lateral chest.  Chest symptoms began couple days ago arm and hand symptoms began shortly before ER arrival.  Pain described as dull and there are no alleviating factors.  She denies cough, fever, chills, and shortness of breath.  No hx of cardiac disease  ? ? ? ?Home Medications ?Prior to Admission medications   ?Medication Sig Start Date End Date Taking? Authorizing Provider  ?levofloxacin (LEVAQUIN) 500 MG tablet Take 1 tablet (500 mg total) by mouth daily. 12/08/21  Yes Traves Majchrzak, PA-C  ?famotidine (PEPCID) 40 MG tablet Take 40 mg by mouth daily. 05/04/21   [provider]  ?omeprazole (PRILOSEC) 20 MG capsule Take 20 mg by mouth 2 (two) times daily. 06/14/21   [provider]  ?   ? ?Allergies    ?Penicillins and Minocycline   ? ?Review of Systems   ?Review of Systems  ?Constitutional:  Negative for fever.  ?HENT:  Negative for congestion and sore throat.   ?Respiratory:  Negative for cough and shortness of breath.   ?Cardiovascular:  Positive for chest pain. Negative for palpitations and leg swelling.  ?Gastrointestinal:  Negative for abdominal pain, nausea and vomiting.  ?Genitourinary:  Negative for dysuria.  ?Musculoskeletal:  Positive for myalgias (pain to left forearm). Negative for neck pain.  ?Skin:  Negative for rash.  ?Neurological:  Positive for numbness (tingling of left hand). Negative for syncope, weakness and headaches.  ?All other systems reviewed and are negative. ? ?Physical Exam ?Updated Vital Signs ?BP 111/79   Pulse 61   Temp 98.6 ?F (37 ?C)   Resp 13   Ht 4\' 11"   (1.499 m)   Wt 65.8 kg   LMP 11/10/2021 (Approximate)   SpO2 93%   BMI 29.29 kg/m?  ?Physical Exam ?Vitals and nursing note reviewed.  ?Constitutional:   ?   General: She is not in acute distress. ?   Appearance: Normal appearance. She is not ill-appearing.  ?HENT:  ?   Mouth/Throat:  ?   Mouth: Mucous membranes are moist.  ?Eyes:  ?   Conjunctiva/sclera: Conjunctivae normal.  ?Cardiovascular:  ?   Rate and Rhythm: Normal rate and regular rhythm.  ?Pulmonary:  ?   Effort: Pulmonary effort is normal. No respiratory distress.  ?   Breath sounds: No wheezing or rales.  ?Abdominal:  ?   Palpations: Abdomen is soft.  ?   Tenderness: There is no abdominal tenderness.  ?Musculoskeletal:     ?   General: No signs of injury. Normal range of motion.  ?   Comments: Grip strength equal bilaterally. No tenderness of the left wrist or elbow with ROM.  No forearm edema.    ?Skin: ?   General: Skin is warm.  ?   Capillary Refill: Capillary refill takes less than 2 seconds.  ?Neurological:  ?   General: No focal deficit present.  ?   Mental Status: She is alert.  ? ? ?ED Results / Procedures / Treatments   ?Labs ?(all labs ordered are listed, but only abnormal results are displayed) ?  Labs Reviewed  ?URINALYSIS, ROUTINE W REFLEX MICROSCOPIC - Abnormal; Notable for the following components:  ?    Result Value  ? Color, Urine STRAW (*)   ? Ketones, ur 5 (*)   ? All other components within normal limits  ?CBC WITH DIFFERENTIAL/PLATELET  ?BASIC METABOLIC PANEL  ?PREGNANCY, URINE  ?TROPONIN I (HIGH SENSITIVITY)  ?TROPONIN I (HIGH SENSITIVITY)  ? ? ?EKG ?EKG Interpretation ? ?Date/Time:  Wednesday December 08 2021 11:22:52 EDT ?Ventricular Rate:  76 ?PR Interval:  90 ?QRS Duration: 87 ?QT Interval:  385 ?QTC Calculation: 433 ?R Axis:   75 ?Text Interpretation: Sinus rhythm Short PR interval Baseline wander in lead(s) V3 V6 No significant change since last tracing Confirmed by Dorie Rank [phone]) on 12/08/2021 11:31:21 AM ? ?Radiology ?DG  Chest Portable 1 View ? ?Result Date: 12/08/2021 ?CLINICAL DATA:  She chest pain. EXAM: PORTABLE CHEST 1 VIEW COMPARISON:  08/18/2020 FINDINGS: The cardiac silhouette, mediastinal and hilar contours are within normal limits and stable. Patchy left basilar density suspicious for early bronchopneumonia. No pulmonary edema or pleural effusions. The bony thorax is intact. IMPRESSION: Suspect early left basilar bronchopneumonia. Electronically Signed   By: Marijo Sanes M.D.   On: 12/08/2021 13:28   ? ? ?Procedures ?Procedures  ? ? ?Medications Ordered in ED ?Medications  ?ibuprofen (ADVIL) tablet 800 mg (800 mg Oral Given 12/08/21 1308)  ?methocarbamol (ROBAXIN) tablet 500 mg (500 mg Oral Given 12/08/21 1308)  ?levofloxacin (LEVAQUIN) tablet 500 mg (500 mg Oral Given 12/08/21 1652)  ? ? ?ED Course/ Medical Decision Making/ A&P ?  ?                        ?Medical Decision Making ?Pt here with left upper and lateral chest pain for several days.  No shortness of breath, cough or fever.  No hx of ACS.   ? ?Vitals reassuring,  PERC negative.  Pt non toxic appearing and does not appear to be in distress.   ? ?Amount and/or Complexity of Data Reviewed ?Labs: ordered. ?   Details: labs unremarkable.  delta trop unchanged. ?Radiology: ordered. ?   Details: CXR shows left basilar PNA. ?ECG/medicine tests: ordered. ?   Details: EKG shows sinus rythymn ? ?Risk ?Prescription drug management. ? ? ?Pt ambulated in dept w/o difficulty.  She is non toxic appearing.  Considered PE, but pt PERC negative.  Doubt ACS.  CXR shows likely PNA.  No concerning sx's for sepsis.  Will tx with abx and she is agreeable to close out pt f/u with PCP, strict return precautions also discussed.  ? ? ? ? ? ? ? ?Final Clinical Impression(s) / ED Diagnoses ?Final diagnoses:  ?Community acquired pneumonia of left lung, unspecified part of lung  ? ? ?Rx / DC Orders ?ED Discharge Orders   ? ?      Ordered  ?  levofloxacin (LEVAQUIN) 500 MG tablet  Daily       ?  12/08/21 1647  ? ?  ?  ? ?  ? ? ?  ?Kem Parkinson, PA-C ?12/12/21 0012 ? ?  ?Dorie Rank, MD ?12/13/21 1048 ? ?

## 2022-04-20 ENCOUNTER — Emergency Department (HOSPITAL_COMMUNITY)
Admission: EM | Admit: 2022-04-20 | Discharge: 2022-04-20 | Disposition: A | Discharge status: Home / Self Care | Payer: No Typology Code available for payment source | Attending: Emergency Medicine | Admitting: Emergency Medicine

## 2022-04-20 ENCOUNTER — Emergency Department (HOSPITAL_COMMUNITY): Payer: No Typology Code available for payment source

## 2022-04-20 ENCOUNTER — Encounter (HOSPITAL_COMMUNITY): Payer: Self-pay

## 2022-04-20 ENCOUNTER — Other Ambulatory Visit: Payer: Self-pay

## 2022-04-20 DIAGNOSIS — M542 Cervicalgia: Secondary | ICD-10-CM | POA: Diagnosis not present

## 2022-04-20 DIAGNOSIS — K529 Noninfective gastroenteritis and colitis, unspecified: Secondary | ICD-10-CM | POA: Diagnosis not present

## 2022-04-20 DIAGNOSIS — M546 Pain in thoracic spine: Secondary | ICD-10-CM | POA: Diagnosis not present

## 2022-04-20 DIAGNOSIS — R000 Tachycardia, unspecified: Secondary | ICD-10-CM | POA: Insufficient documentation

## 2022-04-20 DIAGNOSIS — R102 Pelvic and perineal pain: Secondary | ICD-10-CM | POA: Insufficient documentation

## 2022-04-20 DIAGNOSIS — R1031 Right lower quadrant pain: Secondary | ICD-10-CM | POA: Diagnosis present

## 2022-04-20 DIAGNOSIS — M7918 Myalgia, other site: Secondary | ICD-10-CM

## 2022-04-20 DIAGNOSIS — R1084 Generalized abdominal pain: Secondary | ICD-10-CM

## 2022-04-20 LAB — COMPREHENSIVE METABOLIC PANEL
ALT: 19 U/L (ref 0–44)
AST: 19 U/L (ref 15–41)
Albumin: 4 g/dL (ref 3.5–5.0)
Alkaline Phosphatase: 39 U/L (ref 38–126)
Anion gap: 6 (ref 5–15)
BUN: 13 mg/dL (ref 6–20)
CO2: 27 mmol/L (ref 22–32)
Calcium: 8.9 mg/dL (ref 8.9–10.3)
Chloride: 104 mmol/L (ref 98–111)
Creatinine, Ser: 0.55 mg/dL (ref 0.44–1.00)
GFR, Estimated: >60 mL/min (ref >60)
Glucose, Bld: 107 mg/dL — ABNORMAL HIGH (ref 70–99)
Potassium: 3.3 mmol/L — ABNORMAL LOW (ref 3.5–5.1)
Sodium: 137 mmol/L (ref 135–145)
Total Bilirubin: 0.4 mg/dL (ref 0.3–1.2)
Total Protein: 7.2 g/dL (ref 6.5–8.1)

## 2022-04-20 LAB — URINALYSIS, ROUTINE W REFLEX MICROSCOPIC
Bilirubin Urine: NEGATIVE
Glucose, UA: NEGATIVE mg/dL
Hgb urine dipstick: NEGATIVE
Ketones, ur: 5 mg/dL — AB
Leukocytes,Ua: NEGATIVE
Nitrite: NEGATIVE
Protein, ur: NEGATIVE mg/dL
Specific Gravity, Urine: 1.008 (ref 1.005–1.030)
pH: 6 (ref 5.0–8.0)

## 2022-04-20 LAB — CBC
HCT: 39 % (ref 36.0–46.0)
Hemoglobin: 12.8 g/dL (ref 12.0–15.0)
MCH: 27.5 pg (ref 26.0–34.0)
MCHC: 32.8 g/dL (ref 30.0–36.0)
MCV: 83.9 fL (ref 80.0–100.0)
Platelets: 165 10*3/uL (ref 150–400)
RBC: 4.65 MIL/uL (ref 3.87–5.11)
RDW: 12.4 % (ref 11.5–15.5)
WBC: 7.6 10*3/uL (ref 4.0–10.5)
nRBC: 0 % (ref 0.0–0.2)

## 2022-04-20 LAB — LIPASE, BLOOD: Lipase: 33 U/L (ref 11–51)

## 2022-04-20 LAB — I-STAT BETA HCG BLOOD, ED (MC, WL, AP ONLY): I-stat hCG, quantitative: <5 m[IU]/mL (ref <5)

## 2022-04-20 MED ORDER — ACETAMINOPHEN 325 MG PO TABS
650.0000 mg | ORAL_TABLET | Freq: Once | ORAL | Status: AC
  Administered 2022-04-20: 650 mg via ORAL
  Filled 2022-04-20: qty 2

## 2022-04-20 MED ORDER — ONDANSETRON HCL 4 MG PO TABS: 4.0000 mg | ORAL_TABLET | Freq: Four times a day (QID) | ORAL | 0 refills | Status: DC

## 2022-04-20 MED ORDER — ACETAMINOPHEN 325 MG PO TABS: 650.0000 mg | ORAL_TABLET | Freq: Four times a day (QID) | ORAL | 0 refills | Status: AC | PRN

## 2022-04-20 MED ORDER — IBUPROFEN 600 MG PO TABS: 600.0000 mg | ORAL_TABLET | Freq: Four times a day (QID) | ORAL | 0 refills | Status: DC | PRN

## 2022-04-20 MED ORDER — LORAZEPAM 1 MG PO TABS
2.0000 mg | ORAL_TABLET | Freq: Once | ORAL | Status: AC
  Administered 2022-04-20: 2 mg via ORAL
  Filled 2022-04-20: qty 2

## 2022-04-20 MED ORDER — IOHEXOL 350 MG/ML SOLN
100.0000 mL | Freq: Once | INTRAVENOUS | Status: AC | PRN
  Administered 2022-04-20: 100 mL via INTRAVENOUS

## 2022-04-20 MED ORDER — KETOROLAC TROMETHAMINE 15 MG/ML IJ SOLN
15.0000 mg | Freq: Once | INTRAMUSCULAR | Status: AC
  Administered 2022-04-20: 15 mg via INTRAVENOUS
  Filled 2022-04-20: qty 1

## 2022-04-20 MED ORDER — ACETAMINOPHEN 325 MG PO TABS
ORAL_TABLET | ORAL | Status: AC
  Filled 2022-04-20: qty 1

## 2022-04-20 MED ORDER — POTASSIUM CHLORIDE CRYS ER 20 MEQ PO TBCR
20.0000 meq | EXTENDED_RELEASE_TABLET | Freq: Once | ORAL | Status: AC
  Administered 2022-04-20: 20 meq via ORAL
  Filled 2022-04-20: qty 1

## 2022-04-20 MED ORDER — SODIUM CHLORIDE 0.9 % IV BOLUS
1000.0000 mL | Freq: Once | INTRAVENOUS | Status: AC
  Administered 2022-04-20: 1000 mL via INTRAVENOUS

## 2022-04-20 MED ORDER — ONDANSETRON 4 MG PO TBDP
4.0000 mg | ORAL_TABLET | Freq: Once | ORAL | Status: AC | PRN
  Administered 2022-04-20: 4 mg via ORAL
  Filled 2022-04-20: qty 1

## 2022-04-20 MED ORDER — ONDANSETRON 4 MG PO TBDP
4.0000 mg | ORAL_TABLET | Freq: Once | ORAL | Status: AC
Start: 2022-04-20 — End: 2022-04-20
  Administered 2022-04-20: 4 mg via ORAL
  Filled 2022-04-20: qty 1

## 2022-04-20 NOTE — ED Notes (Signed)
DC instructions reviewed with pt. PT verbalized understanding. Pt DC °

## 2022-04-20 NOTE — ED Notes (Signed)
Pt still in US

## 2022-04-20 NOTE — Discharge Instructions (Signed)
Extensive work-up today including a CT abdomen and pelvis with IV contrast demonstrates no acute abdominal etiologies.  Small right-sided adnexal cyst found.  Follow-up pelvic ultrasound demonstrates small right-sided follicular cyst.  Right upper quadrant ultrasound ordered for right upper quadrant pain and demonstrates no acute process.  Stable laboratory studies including for liver function, pancreas function, and kidney function.  Urine analysis demonstrates no urinary tract infection.    EKG stable with no signs of heart attack.

## 2022-04-20 NOTE — ED Triage Notes (Signed)
Pt reports RLQ pain that radiates to her flank since Sunday and n/v, chills last night with diarrhea.

## 2022-04-20 NOTE — ED Provider Notes (Signed)
[facility] EMERGENCY DEPARTMENT Provider Note   CSN: [id number] Arrival date & time: 04/20/22  0741     History  Chief Complaint  Patient presents with   Abdominal Pain   Emesis    Carol [name] is a 31 y.o. female.  Patient is a 31 year old female with past medical history of due to tumor cerebri, panic attacks, depression, and headaches presenting for complaints of abdominal pain.  Patient reports right lower quadrant abdominal pain with radiation to the right flank, nausea without vomiting, and diarrhea x24 hours.  Admits to fever took Tylenol prior to arrival.  No vomiting.  Denies dysuria, increased frequency, urgency, or hematuria.  Also endorses pelvic pain with possible " bump".    The history is provided by the patient. No language interpreter was used.  Abdominal Pain Associated symptoms: diarrhea and nausea   Associated symptoms: no chest pain, no chills, no cough, no dysuria, no fever, no hematuria, no shortness of breath, no sore throat and no vomiting   Emesis Associated symptoms: abdominal pain and diarrhea   Associated symptoms: no arthralgias, no chills, no cough, no fever and no sore throat        Home Medications Prior to Admission medications   Medication Sig Start Date End Date Taking? Authorizing Provider  acetaminophen (TYLENOL) 325 MG tablet Take 2 tablets (650 mg total) by mouth every 6 (six) hours as needed for mild pain or fever. 123XX123  Yes Campbell Stall P, DO  ibuprofen (ADVIL) 600 MG tablet Take 1 tablet (600 mg total) by mouth every 6 (six) hours as needed for mild pain, fever or headache. 123XX123  Yes Campbell Stall P, DO  ondansetron (ZOFRAN) 4 MG tablet Take 1 tablet (4 mg total) by mouth every 6 (six) hours. [id number]  Yes Campbell Stall P, DO  famotidine (PEPCID) 40 MG tablet Take 40 mg by mouth daily. 05/04/21   [provider]  levofloxacin (LEVAQUIN) 500 MG tablet Take 1 tablet (500 mg total) by mouth  daily. 12/08/21   Triplett, Tammy, PA-C  omeprazole (PRILOSEC) 20 MG capsule Take 20 mg by mouth 2 (two) times daily. 06/14/21   [provider]      Allergies    Penicillins and Minocycline    Review of Systems   Review of Systems  Constitutional:  Negative for chills and fever.  HENT:  Negative for ear pain and sore throat.   Eyes:  Negative for pain and visual disturbance.  Respiratory:  Negative for cough and shortness of breath.   Cardiovascular:  Negative for chest pain and palpitations.  Gastrointestinal:  Positive for abdominal pain, diarrhea and nausea. Negative for vomiting.  Genitourinary:  Negative for dysuria and hematuria.  Musculoskeletal:  Negative for arthralgias and back pain.  Skin:  Negative for color change and rash.  Neurological:  Negative for seizures and syncope.  All other systems reviewed and are negative.   Physical Exam Updated Vital Signs BP 116/75   Pulse (!) 128   Temp (!) 102.1 F (38.9 C) (Oral)   Resp (!) 23   Ht 4\' 11"  (1.499 m)   Wt 65.8 kg   LMP 03/30/2022 (Approximate)   SpO2 97%   BMI 29.29 kg/m  Physical Exam Vitals and nursing note reviewed.  Constitutional:      General: She is not in acute distress.    Appearance: She is well-developed.  HENT:     Head: Normocephalic and atraumatic.  Eyes:  Conjunctiva/sclera: Conjunctivae normal.  Cardiovascular:     Rate and Rhythm: Regular rhythm. Tachycardia present.     Heart sounds: No murmur heard. Pulmonary:     Effort: Pulmonary effort is normal. No respiratory distress.     Breath sounds: Normal breath sounds.  Abdominal:     Palpations: Abdomen is soft.     Tenderness: There is generalized abdominal tenderness. There is no guarding or rebound.  Musculoskeletal:        General: No swelling.     Cervical back: Neck supple.  Skin:    General: Skin is warm and dry.     Capillary Refill: Capillary refill takes less than 2 seconds.  Neurological:     Mental Status:  She is alert.  Psychiatric:        Mood and Affect: Mood normal.     ED Results / Procedures / Treatments   Labs (all labs ordered are listed, but only abnormal results are displayed) Labs Reviewed  COMPREHENSIVE METABOLIC PANEL - Abnormal; Notable for the following components:      Result Value   Potassium 3.3 (*)    Glucose, Bld 107 (*)    All other components within normal limits  URINALYSIS, ROUTINE W REFLEX MICROSCOPIC - Abnormal; Notable for the following components:   Color, Urine STRAW (*)    Ketones, ur 5 (*)    All other components within normal limits  LIPASE, BLOOD  CBC  I-STAT BETA HCG BLOOD, ED ([facility], [facility], [facility] ONLY)    EKG None  Radiology No results found.  Procedures Procedures    Medications Ordered in ED Medications  ondansetron (ZOFRAN-ODT) disintegrating tablet 4 mg (4 mg Oral Given 04/20/22 0754)  sodium chloride 0.9 % bolus 1,000 mL (0 mLs Intravenous Stopped 04/20/22 1504)  ketorolac (TORADOL) 15 MG/ML injection 15 mg (15 mg Intravenous Given 04/20/22 1214)  potassium chloride SA (KLOR-CON M) CR tablet 20 mEq (20 mEq Oral Given 04/20/22 1036)  LORazepam (ATIVAN) tablet 2 mg (2 mg Oral Given 04/20/22 1202)  iohexol (OMNIPAQUE) 350 MG/ML injection 100 mL (100 mLs Intravenous Contrast Given 04/20/22 1341)  ketorolac (TORADOL) 15 MG/ML injection 15 mg (15 mg Intravenous Given 04/20/22 1657)  acetaminophen (TYLENOL) tablet 650 mg (650 mg Oral Given 04/20/22 1658)  ondansetron (ZOFRAN-ODT) disintegrating tablet 4 mg (4 mg Oral Given 04/20/22 1733)    ED Course/ Medical Decision Making/ A&P                           Medical Decision Making Amount and/or Complexity of Data Reviewed Labs: ordered. Radiology: ordered.  Risk OTC drugs. Prescription drug management.   85:57 PM 31 year old female with past medical history of due to tumor cerebri, panic attacks, depression, and headaches presenting for complaints of abdominal pain.  Patient is alert and oriented  3, no acute distress, low-grade temperature of 100.1 F with minimal tachycardia 101 bpm.  Abdomen is soft with minimal generalized tenderness worse in the RUQ and RLQ.  No focal tenderness.  Patient is otherwise well-appearing and in no acute distress.  Of note, the longer talk to the patient the more her heart rate goes up.  When I stepped out of the room her heart rate goes down.  Reevaluation patient is very tearful and hyperventilating.  When asked what I can do to help patient states she has pain all over.  Patient says she now has pain in her head, her spine, her abdomen, and  tingling in her hands.  Heart rate up to 160 bpm.  Family at bedside notes patient frequently has panic attacks.  Will treat pain, re-evaluation temperature-treat as needed, offer Ativan, and reevaluate.  Patient appears more comfortable on re-evaluation. RUQ US demonstrates no acute process. NO cholelithiasis or cholecystitis. CT abdomen and pelvis demonstrates no acute process. No appendicitis. No bowel obstruction. No free air. No signs of infection. Ovarian cyst noted. Transvaginal US demonstrates ovarian follicular cyst. Labs are stable with no leukocytosis. At this time patient has only 1 sirs criteria and does not qualify for sepsis. She is non-toxic appearing and in no acute distress. UA demonstrates no UTI. No pyelo. Viral gastroenteritis considered.    Patient in no distress and overall condition improved here in the ED. Detailed discussions were had with the patient regarding current findings, and need for close f/u with PCP or on call doctor. The patient has been instructed to return immediately if the symptoms worsen in any way for re-evaluation. Patient verbalized understanding and is in agreement with current care plan. All questions answered prior to discharge. Pt is discharged into the care of her family members.           Final Clinical Impression(s) / ED Diagnoses Final diagnoses:  Generalized  abdominal pain  Neck pain  Acute right-sided thoracic back pain  Musculoskeletal pain  Gastroenteritis    Rx / DC Orders ED Discharge Orders          Ordered    ibuprofen (ADVIL) 600 MG tablet  Every 6 hours PRN        04/20/22 1706    acetaminophen (TYLENOL) 325 MG tablet  Every 6 hours PRN        04/20/22 1706    ondansetron (ZOFRAN) 4 MG tablet  Every 6 hours        04/20/22 1706              Franne Forts, DO 04/24/22 2125

## 2022-04-20 NOTE — ED Notes (Signed)
Pt transported to US

## 2022-05-22 ENCOUNTER — Ambulatory Visit (HOSPITAL_COMMUNITY)
Admission: EM | Admit: 2022-05-22 | Discharge: 2022-05-22 | Disposition: A | Discharge status: Home / Self Care | Payer: No Typology Code available for payment source

## 2022-05-22 ENCOUNTER — Encounter (HOSPITAL_COMMUNITY): Payer: Self-pay

## 2022-05-22 DIAGNOSIS — K047 Periapical abscess without sinus: Secondary | ICD-10-CM | POA: Diagnosis not present

## 2022-05-22 DIAGNOSIS — E271 Primary adrenocortical insufficiency: Secondary | ICD-10-CM

## 2022-05-22 DIAGNOSIS — K0889 Other specified disorders of teeth and supporting structures: Secondary | ICD-10-CM | POA: Diagnosis not present

## 2022-05-22 HISTORY — DX: Primary adrenocortical insufficiency: E27.1

## 2022-05-22 MED ORDER — CLINDAMYCIN HCL 300 MG PO CAPS: 300.0000 mg | ORAL_CAPSULE | Freq: Three times a day (TID) | ORAL | 0 refills | Status: AC

## 2022-05-22 MED ORDER — LIDOCAINE VISCOUS HCL 2 % MT SOLN: 15.0000 mL | OROMUCOSAL | 0 refills | Status: DC | PRN

## 2022-05-22 NOTE — Discharge Instructions (Addendum)
Please take the mediation 3 times daily for the next 5 days. Take with food to avoid upset stomach.  Try the viscous lidocaine swish and spit to help with mouth pain. You can use tylenol/ibuprofen as needed for pain.  Follow up with your dentist at your visit in 2 weeks.  Please go to the emergency department if symptoms worsen.

## 2022-05-22 NOTE — ED Triage Notes (Signed)
Pt is here for tooth pain that has been radiating to ear x4-5 days. Pt denies fever

## 2022-05-22 NOTE — ED Provider Notes (Signed)
[facility]    CSN: [id number] Arrival date & time: 05/22/22  1704      History   Chief Complaint Chief Complaint  Patient presents with   Dental Pain    HPI Carol [name] is a 31 y.o. female.  Presents with 4 day history of tooth and mouth pain. Worsened today. Reports bottom left molar has pain around tooth and gum. No fever, bleeding, drainage  Has not seen a dentist in about 12 years. New patient appointment in 2 weeks.  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)    got from the chicken pox as a child    Panic attacks    Pseudotumor cerebri     Patient Active Problem List   Diagnosis Date Noted   Autoimmune Addison's disease (HCC) 05/22/2022   Pelvic pain 07/21/2021   Abnormal uterine bleeding (AUB) 07/21/2021   Anxiety and depression 07/21/2021   Depression 10/11/2013   Headache(784.0) 06/25/2013    Past Surgical History:  Procedure Laterality Date   addenoidectomy     LUMBAR PUNCTURE     PYLOROMYOTOMY     TONSILLECTOMY      OB History     Gravida  2   Para  2   Term  2   Preterm  0   AB  0   Living  2      SAB  0   IAB  0   Ectopic  0   Multiple  0   Live Births  2            Home Medications    Prior to Admission medications   Medication Sig Start Date End Date Taking? Authorizing Provider  clindamycin (CLEOCIN) 300 MG capsule Take 1 capsule (300 mg total) by mouth 3 (three) times daily for 5 days. 05/22/22 05/27/22 Yes Loa Idler, PA-C  lidocaine (XYLOCAINE) 2 % solution Use as directed 15 mLs in the mouth or throat as needed for mouth pain. 05/22/22  Yes River Ambrosio, Lurena Joiner, PA-C  omeprazole  (PRILOSEC) 20 MG capsule TAKE 1 CAPSULE (20 MG TOTAL) BY MOUTH 2 TIMES DAILY 06/14/21  Yes [provider]  acetaminophen (TYLENOL) 325 MG tablet Take 2 tablets (650 mg total) by mouth every 6 (six) hours as needed for mild pain or fever. 04/20/22   Edwin Dada P, DO  busPIRone (BUSPAR) 5 MG tablet Take 5 mg by mouth 2 (two) times daily. 02/23/22   [provider]  famotidine (PEPCID) 40 MG tablet Take 40 mg by mouth daily. 05/04/21   [provider]  ibuprofen (ADVIL) 600 MG tablet Take 1 tablet (600 mg total) by mouth every 6 (six) hours as needed for mild pain, fever or headache. 04/20/22   Edwin Dada P, DO  levofloxacin (LEVAQUIN) 500 MG tablet Take 1 tablet (500 mg total) by mouth daily. 12/08/21   Triplett, Tammy, PA-C  omeprazole (PRILOSEC) 20 MG capsule Take 20 mg by mouth 2 (two) times daily. 06/14/21   [provider]  ondansetron (ZOFRAN) 4 MG tablet Take 1 tablet (4 mg total) by mouth every 6 (six) hours. 04/20/22   Franne Forts, DO  Family History Family History  Problem Relation Age of Onset   Diabetes Paternal Grandfather    Cancer Paternal Grandmother    Diabetes Maternal Grandmother    Heart disease Maternal Grandmother    Osteoporosis Maternal Grandmother    Atrial fibrillation Maternal Grandfather    Asthma Father    Diabetes Mother    Asthma Sister    Mental illness Sister     Social History Social History   Tobacco Use   Smoking status: Never   Smokeless tobacco: Never  Vaping Use   Vaping Use: Never used  Substance Use Topics   Alcohol use: No   Drug use: No     Allergies   Penicillins and Minocycline   Review of Systems Review of Systems Per HPI  Physical Exam Triage Vital Signs ED Triage Vitals  Enc Vitals Group     BP 05/22/22 1813 (!) 146/90     Pulse Rate 05/22/22 1813 72     Resp 05/22/22 1813 12     Temp 05/22/22 1813 98.3 F (36.8 C)     Temp src --      SpO2 05/22/22 1813 99 %     Weight  05/22/22 1811 152 lb (68.9 kg)     Height 05/22/22 1811 4\' 11"  (1.499 m)     Head Circumference --      Peak Flow --      Pain Score 05/22/22 1811 5     Pain Loc --      Pain Edu? --      Excl. in [place]? --    No data found.  Updated Vital Signs BP 124/81 (BP Location: Right Arm)   Pulse 72   Temp 98.3 F (36.8 C)   Resp 12   Ht 4\' 11"  (1.499 m)   Wt 152 lb (68.9 kg)   LMP 04/26/2022   SpO2 99%   BMI 30.70 kg/m   Physical Exam Vitals and nursing note reviewed.  Constitutional:      Appearance: Normal appearance.  HENT:     Head: No masses.     Jaw: There is normal jaw occlusion. No trismus, tenderness, swelling or pain on movement.     Mouth/Throat:     Mouth: Mucous membranes are moist.     Dentition: Abnormal dentition. Gingival swelling and dental caries present.     Pharynx: No posterior oropharyngeal erythema.      Comments: Redness and swelling of gum. No pain with pressure on tooth, but gum and cheek very tender Eyes:     Conjunctiva/sclera: Conjunctivae normal.  Cardiovascular:     Rate and Rhythm: Normal rate and regular rhythm.     Pulses: Normal pulses.     Heart sounds: Normal heart sounds.  Pulmonary:     Effort: Pulmonary effort is normal.     Breath sounds: Normal breath sounds.  Lymphadenopathy:     Cervical: No cervical adenopathy.  Neurological:     Mental Status: She is alert and oriented to person, place, and time.      UC Treatments / Results  Labs (all labs ordered are listed, but only abnormal results are displayed) Labs Reviewed - No data to display  EKG  Radiology No results found.  Procedures Procedures (including critical care time)  Medications Ordered in UC Medications - No data to display  Initial Impression / Assessment and Plan / UC Course  I have reviewed the triage vital signs and the nursing notes.  Pertinent labs &  imaging results that were available during my care of the patient were reviewed by me and  considered in my medical decision making (see chart for details).  Treat for possible dental infection/abscess. PCN allergy. Clinda 3x daily for 5 days. Viscous lido as needed. Dentist visit in 2 weeks, will follow up with them. ED for worsening symptoms. Patient agrees to plan  Final Clinical Impressions(s) / UC Diagnoses   Final diagnoses:  Dental abscess  Pain, dental     Discharge Instructions      Please take the mediation 3 times daily for the next 5 days. Take with food to avoid upset stomach.  Try the viscous lidocaine swish and spit to help with mouth pain. You can use tylenol/ibuprofen as needed for pain.  Follow up with your dentist at your visit in 2 weeks.  Please go to the emergency department if symptoms worsen.    ED Prescriptions     Medication Sig Dispense Auth. Provider   clindamycin (CLEOCIN) 300 MG capsule Take 1 capsule (300 mg total) by mouth 3 (three) times daily for 5 days. 15 capsule Malori Myers, PA-C   lidocaine (XYLOCAINE) 2 % solution Use as directed 15 mLs in the mouth or throat as needed for mouth pain. 100 mL Tanishi Nault, Lurena Joiner, PA-C      PDMP not reviewed this encounter.   Kathrine Haddock 05/22/22 1844

## 2022-12-03 ENCOUNTER — Ambulatory Visit
Admission: EM | Admit: 2022-12-03 | Discharge: 2022-12-03 | Disposition: A | Discharge status: Home / Self Care | Payer: No Typology Code available for payment source | Attending: Family Medicine | Admitting: Family Medicine

## 2022-12-03 ENCOUNTER — Ambulatory Visit (INDEPENDENT_AMBULATORY_CARE_PROVIDER_SITE_OTHER): Payer: No Typology Code available for payment source

## 2022-12-03 DIAGNOSIS — M79644 Pain in right finger(s): Secondary | ICD-10-CM

## 2022-12-03 NOTE — Discharge Instructions (Signed)
You may use ibuprofen, Tylenol for pain, warm Epsom salt soaks, compression wraps, ice, elevation

## 2022-12-03 NOTE — ED Provider Notes (Signed)
[facility]    CSN: [id number] Arrival date & time: 12/03/22  0935      History   Chief Complaint Chief Complaint  Patient presents with   thumb injury    HPI Makenzi Rosalynn Molyneaux is a 32 y.o. female.   Patient presenting today with right thumb pain and a swollen blue area between the thumb and first finger after getting her hand caught in her son's zipper pocket yesterday while they were playing.  She states range of motion is painful and stiff but no numbness, tingling, loss of range of motion, skin injury.  So far trying anything over-the-counter for symptoms.    Past Medical History:  Diagnosis Date   Autoimmune Addison's disease ([place])    Depression 10/11/2013   Headache(784.0) 06/25/2013   Late prenatal care 03/06/2013   Maternal group B streptococcal infection 05/27/2013   Treat in labor  >>  Allergic PCN, Resistant to Clinda  >> needs Vanc    Necrotizing fasciitis due to Streptococcus pyogenes ([place])    got from the chicken pox as a child    Panic attacks    Pseudotumor cerebri     Patient Active Problem List   Diagnosis Date Noted   Autoimmune Addison's disease ([place]) 05/22/2022   Pelvic pain 07/21/2021   Abnormal uterine bleeding (AUB) 07/21/2021   Anxiety and depression 07/21/2021   Depression 10/11/2013   Headache(784.0) 06/25/2013    Past Surgical History:  Procedure Laterality Date   addenoidectomy     LUMBAR PUNCTURE     PYLOROMYOTOMY     TONSILLECTOMY      OB History     Gravida  2   Para  2   Term  2   Preterm  0   AB  0   Living  2      SAB  0   IAB  0   Ectopic  0   Multiple  0   Live Births  2            Home Medications    Prior to Admission medications   Medication Sig Start Date End Date Taking? Authorizing Provider  acetaminophen (TYLENOL) 325 MG tablet Take 2 tablets (650 mg total) by mouth every 6 (six) hours as needed for mild pain or fever. 123XX123   Campbell Stall P, DO  busPIRone  (BUSPAR) 5 MG tablet Take 5 mg by mouth 2 (two) times daily. 02/23/22   [provider]  famotidine (PEPCID) 40 MG tablet Take 40 mg by mouth daily. 05/04/21   [provider]  ibuprofen (ADVIL) 600 MG tablet Take 1 tablet (600 mg total) by mouth every 6 (six) hours as needed for mild pain, fever or headache. [id number]   Campbell Stall P, DO  levofloxacin (LEVAQUIN) 500 MG tablet Take 1 tablet (500 mg total) by mouth daily. 12/08/21   Triplett, Tammy, PA-C  lidocaine (XYLOCAINE) 2 % solution Use as directed 15 mLs in the mouth or throat as needed for mouth pain. 05/22/22   Rising, Wells Guiles, PA-C  omeprazole (PRILOSEC) 20 MG capsule Take 20 mg by mouth 2 (two) times daily. 06/14/21   [provider]  omeprazole (PRILOSEC) 20 MG capsule TAKE 1 CAPSULE (20 MG TOTAL) BY MOUTH 2 TIMES DAILY 06/14/21   [provider]  ondansetron (ZOFRAN) 4 MG tablet Take 1 tablet (4 mg total) by mouth every 6 (six) hours. [id number]   Lianne Cure, DO    Family History Family  History  Problem Relation Age of Onset   Diabetes Paternal Grandfather    Cancer Paternal Grandmother    Diabetes Maternal Grandmother    Heart disease Maternal Grandmother    Osteoporosis Maternal Grandmother    Atrial fibrillation Maternal Grandfather    Asthma Father    Diabetes Mother    Asthma Sister    Mental illness Sister     Social History Social History   Tobacco Use   Smoking status: Never   Smokeless tobacco: Never  Vaping Use   Vaping Use: Never used  Substance Use Topics   Alcohol use: No   Drug use: No     Allergies   Penicillins and Minocycline   Review of Systems Review of Systems PER HPI  Physical Exam Triage Vital Signs ED Triage Vitals  Enc Vitals Group     BP 12/03/22 0943 102/78     Pulse Rate 12/03/22 0943 74     Resp 12/03/22 0943 20     Temp 12/03/22 0943 98.2 F (36.8 C)     Temp Source 12/03/22 0943 Oral     SpO2 12/03/22 0943 98 %     Weight --      Height  --      Head Circumference --      Peak Flow --      Pain Score 12/03/22 0947 5     Pain Loc --      Pain Edu? --      Excl. in [place]? --    No data found.  Updated Vital Signs BP 102/78 (BP Location: Right Arm)   Pulse 74   Temp 98.2 F (36.8 C) (Oral)   Resp 20   LMP 11/10/2022 (Approximate)   SpO2 98%   Visual Acuity Right Eye Distance:   Left Eye Distance:   Bilateral Distance:    Right Eye Near:   Left Eye Near:    Bilateral Near:     Physical Exam Vitals and nursing note reviewed.  Constitutional:      Appearance: Normal appearance. She is not ill-appearing.  HENT:     Head: Atraumatic.  Eyes:     Extraocular Movements: Extraocular movements intact.     Conjunctiva/sclera: Conjunctivae normal.  Cardiovascular:     Rate and Rhythm: Normal rate and regular rhythm.     Heart sounds: Normal heart sounds.  Pulmonary:     Effort: Pulmonary effort is normal.     Breath sounds: Normal breath sounds.  Musculoskeletal:        General: Swelling, tenderness and signs of injury present. No deformity. Normal range of motion.     Cervical back: Normal range of motion and neck supple.     Comments: Trace edema to the right thumb, range of motion intact but painful, mildly tender to palpation diffusely  Skin:    General: Skin is warm.     Comments: Very small superficial hematoma between the thumb and forefinger, nontender to palpation  Neurological:     Mental Status: She is alert and oriented to person, place, and time.     Comments: Right upper extremity neurovascularly intact  Psychiatric:        Mood and Affect: Mood normal.        Thought Content: Thought content normal.        Judgment: Judgment normal.      UC Treatments / Results  Labs (all labs ordered are listed, but only abnormal results are displayed) Labs Reviewed -  No data to display  EKG   Radiology DG Hand Complete Right  Result Date: 12/03/2022 CLINICAL DATA:  Right thumb pain and swelling  for 1 day after injury EXAM: RIGHT HAND - COMPLETE 3+ VIEW COMPARISON:  None Available. FINDINGS: Frontal, oblique, and lateral views of the right hand are obtained. There is a small well corticated ossific density along the radial aspect of the first interphalangeal joint, likely sequela of chronic trauma or related to degenerative change. No evidence of acute fracture, subluxation, or dislocation. Joint spaces are well preserved. Soft tissue swelling within the thenar eminence and between the first and second metacarpal. IMPRESSION: 1. Soft tissue swelling at the thenar eminence. 2. Small well corticated ossific density adjacent to the first interphalangeal joint, favor sequela of previous injury or chronic degenerative change. No evidence of acute fracture. Electronically Signed   By: Randa Ngo M.D.   On: 12/03/2022 10:31    Procedures Procedures (including critical care time)  Medications Ordered in UC Medications - No data to display  Initial Impression / Assessment and Plan / UC Course  I have reviewed the triage vital signs and the nursing notes.  Pertinent labs & imaging results that were available during my care of the patient were reviewed by me and considered in my medical decision making (see chart for details).     Patient concern for blood clots, reassurance given regarding hematoma and discussed Epsom salt soaks, elevation, ice off-and-on.  X-ray of the right hand negative for any acute bony abnormalities.  Ace wrap applied, discussed supportive home care and return precautions.  Over-the-counter pain relievers as needed.  Final Clinical Impressions(s) / UC Diagnoses   Final diagnoses:  Pain of right thumb     Discharge Instructions      You may use ibuprofen, Tylenol for pain, warm Epsom salt soaks, compression wraps, ice, elevation    ED Prescriptions   None    PDMP not reviewed this encounter.   Volney American, [place] 12/03/22 1120

## 2022-12-03 NOTE — ED Triage Notes (Signed)
Pt reports after she slammed her right thumb in the door, she noticed a "blue bouncy" swollen spot in the area between her thumb and index finger x 1 day. Pt has ROM in her right thumb  she just cannot touch the palm of her hand all the way with that thumb. Reports throbbing pain but overall feels fine.

## 2023-04-09 ENCOUNTER — Ambulatory Visit
Admission: EM | Admit: 2023-04-09 | Discharge: 2023-04-09 | Disposition: A | Discharge status: Home / Self Care | Payer: No Typology Code available for payment source

## 2023-04-09 ENCOUNTER — Other Ambulatory Visit: Payer: Self-pay

## 2023-04-09 ENCOUNTER — Encounter: Payer: Self-pay | Admitting: Emergency Medicine

## 2023-04-09 DIAGNOSIS — M542 Cervicalgia: Secondary | ICD-10-CM

## 2023-04-09 NOTE — ED Triage Notes (Signed)
Pt reports right neck pain, headache, increased in blurred vision occurrences since yesterday. Pt reports history of blurred vision and intermittent extremity numbness at baseline but reports "that's worse" since "my neck vein seems thicker/larger."  Pt reports is currently being evaluated for auto-immune disorder and POTS but no official diagnosis yet.

## 2023-04-12 NOTE — ED Provider Notes (Signed)
[facility]    CSN: [id number] Arrival date & time: 04/09/23  1515      History   Chief Complaint Chief Complaint  Patient presents with   Neck Pain    HPI Carol [name] is a 32 y.o. female.   Patient presenting today with acute on chronic right-sided neck pain, headaches, increased blurred vision that she first noticed worsening yesterday.  She states she is being worked up for multiple autoimmune disorders and POTS with the symptoms ongoing for the past 2 years, however she started noticing things slightly worsened yesterday and that a vein in her neck on the right seems thicker.  When she googled her symptoms she became concerned about jugular venous distention or other possible causes of this.  She denies chest pain, shortness of breath, orthopnea, extremity swelling, dizziness, unstable gait, mental status changes, recent changes to medications, lifestyle.  Not trying anything for the symptoms.    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)    got from the chicken pox as a child    Panic attacks    Pseudotumor cerebri     Patient Active Problem List   Diagnosis Date Noted   Autoimmune Addison's disease (HCC) 05/22/2022   Pelvic pain 07/21/2021   Abnormal uterine bleeding (AUB) 07/21/2021   Anxiety and depression 07/21/2021   Depression 10/11/2013   Headache(784.0) 06/25/2013    Past Surgical History:  Procedure Laterality Date   addenoidectomy     LUMBAR PUNCTURE     PYLOROMYOTOMY     TONSILLECTOMY      OB History     Gravida  2   Para  2   Term  2   Preterm  0   AB  0   Living  2      SAB  0   IAB  0   Ectopic  0    Multiple  0   Live Births  2            Home Medications    Prior to Admission medications   Medication Sig Start Date End Date Taking? Authorizing Provider  omeprazole (PRILOSEC) 40 MG capsule Take 40 mg by mouth daily.   Yes [provider]  acetaminophen (TYLENOL) 325 MG tablet Take 2 tablets (650 mg total) by mouth every 6 (six) hours as needed for mild pain or fever. 04/20/22   Edwin Dada P, DO  busPIRone (BUSPAR) 5 MG tablet Take 5 mg by mouth 2 (two) times daily. 02/23/22   [provider]  famotidine (PEPCID) 40 MG tablet Take 40 mg by mouth daily. 05/04/21   [provider]  ibuprofen (ADVIL) 600 MG tablet Take 1 tablet (600 mg total) by mouth every 6 (six) hours as needed for mild pain, fever or headache. 04/20/22   Edwin Dada P, DO  levofloxacin (LEVAQUIN) 500 MG tablet Take 1 tablet (500 mg total) by mouth daily. 12/08/21   Triplett, Tammy, PA-C  lidocaine (XYLOCAINE) 2 % solution Use as directed 15 mLs in the mouth or throat as needed for mouth pain. 05/22/22   Rising, Lurena Joiner, PA-C  omeprazole (PRILOSEC) 20 MG capsule Take 20  mg by mouth 2 (two) times daily. 06/14/21   [provider]  omeprazole (PRILOSEC) 20 MG capsule TAKE 1 CAPSULE (20 MG TOTAL) BY MOUTH 2 TIMES DAILY 06/14/21   [provider]  ondansetron (ZOFRAN) 4 MG tablet Take 1 tablet (4 mg total) by mouth every 6 (six) hours. 04/20/22   Franne Forts, DO    Family History Family History  Problem Relation Age of Onset   Diabetes Paternal Grandfather    Cancer Paternal Grandmother    Diabetes Maternal Grandmother    Heart disease Maternal Grandmother    Osteoporosis Maternal Grandmother    Atrial fibrillation Maternal Grandfather    Asthma Father    Diabetes Mother    Asthma Sister    Mental illness Sister     Social History Social History   Tobacco Use   Smoking status: Never   Smokeless tobacco: Never  Vaping Use   Vaping status: Never Used   Substance Use Topics   Alcohol use: No   Drug use: No     Allergies   Penicillins and Minocycline   Review of Systems Review of Systems Per HPI  Physical Exam Triage Vital Signs ED Triage Vitals  Encounter Vitals Group     BP 04/09/23 1525 118/81     Systolic BP Percentile --      Diastolic BP Percentile --      Pulse Rate 04/09/23 1525 78     Resp 04/09/23 1525 20     Temp 04/09/23 1525 98 F (36.7 C)     Temp Source 04/09/23 1525 Oral     SpO2 04/09/23 1525 99 %     Weight --      Height --      Head Circumference --      Peak Flow --      Pain Score 04/09/23 1522 0     Pain Loc --      Pain Education --      Exclude from Growth Chart --    No data found.  Updated Vital Signs BP 118/81 (BP Location: Right Arm)   Pulse 78   Temp 98 F (36.7 C) (Oral)   Resp 20   LMP 03/31/2023 (Approximate)   SpO2 99%   Visual Acuity Right Eye Distance:   Left Eye Distance:   Bilateral Distance:    Right Eye Near:   Left Eye Near:    Bilateral Near:     Physical Exam Vitals and nursing note reviewed.  Constitutional:      Appearance: Normal appearance. She is not ill-appearing.  HENT:     Head: Atraumatic.     Mouth/Throat:     Mouth: Mucous membranes are moist.  Eyes:     Extraocular Movements: Extraocular movements intact.     Conjunctiva/sclera: Conjunctivae normal.  Neck:     Thyroid: No thyromegaly.  Cardiovascular:     Rate and Rhythm: Normal rate and regular rhythm.     Heart sounds: Normal heart sounds.     Comments: No JVD noted on exam, no bruits bilaterally on auscultation Pulmonary:     Effort: Pulmonary effort is normal.     Breath sounds: Normal breath sounds. No wheezing or rales.  Musculoskeletal:        General: No swelling or tenderness. Normal range of motion.     Cervical back: Normal range of motion and neck supple. No tenderness.     Comments: Minimal tension and spasm palpable to the  right SCM which is what she is pointing out  today on exam as reported concern  Lymphadenopathy:     Cervical: No cervical adenopathy.  Skin:    General: Skin is warm and dry.     Findings: No bruising or erythema.  Neurological:     Mental Status: She is alert and oriented to person, place, and time.     Cranial Nerves: No cranial nerve deficit.     Motor: No weakness.     Gait: Gait normal.     Comments: All 4 extremities neurovascular intact  Psychiatric:        Mood and Affect: Mood normal.        Thought Content: Thought content normal.        Judgment: Judgment normal.      UC Treatments / Results  Labs (all labs ordered are listed, but only abnormal results are displayed) Labs Reviewed - No data to display  EKG   Radiology No results found.  Procedures Procedures (including critical care time)  Medications Ordered in UC Medications - No data to display  Initial Impression / Assessment and Plan / UC Course  I have reviewed the triage vital signs and the nursing notes.  Pertinent labs & imaging results that were available during my care of the patient were reviewed by me and considered in my medical decision making (see chart for details).     Vital signs and exam benign and very reassuring today and patient appears in no acute distress.  Suspect some SCM inflammation/spasm may be what she is noting and discussed massage, stretches to help with this.  Reassurance given that it does not appear at this time to be related to a new vascular issue.  She has multiple follow-up scheduled already upcoming as she is being worked up for multiple issues currently.  Discussed return precautions for worsening symptoms.  Final Clinical Impressions(s) / UC Diagnoses   Final diagnoses:  Neck pain on right side   Discharge Instructions   None    ED Prescriptions   None    PDMP not reviewed this encounter.   Particia Nearing, [place] 04/12/23 1204

## 2023-07-24 ENCOUNTER — Ambulatory Visit: Payer: No Typology Code available for payment source | Admitting: Diagnostic Neuroimaging

## 2023-07-24 ENCOUNTER — Encounter: Payer: Self-pay | Admitting: Diagnostic Neuroimaging

## 2023-07-24 VITALS — BP 122/88 | HR 79 | Ht 59.0 in | Wt 169.6 lb

## 2023-07-24 DIAGNOSIS — F419 Anxiety disorder, unspecified: Secondary | ICD-10-CM | POA: Diagnosis not present

## 2023-07-24 DIAGNOSIS — H538 Other visual disturbances: Secondary | ICD-10-CM

## 2023-07-24 NOTE — Progress Notes (Signed)
[facility]  PATIENT: Carol [name] DOB: 10-Aug-1991  REFERRING CLINICIAN: Currence, Vladimir Crofts, PA-C  HISTORY FROM: patient  REASON FOR VISIT: new consult    HISTORICAL  CHIEF COMPLAINT:  Chief Complaint  Patient presents with   Follow-up    Patient in room #7 with her mother. Patient states she still has face numbness and press in her nose. Patient states she has so numbness and pain in both hands. Patient states the numbness on the left side of her face.    HISTORY OF PRESENT ILLNESS:   UPDATE (07/24/23, VRP): Since last visit, having intermittent left facial numbness since March 2024. Also with urges to twitch and move face / nose. Sometimes with throat closing sensation (globus sensation). Some intermittent shaky and blurred vision. Saw virtual eye doctor, rheumatology and allergist (no causes found).   PRIOR HPI (161): 32 year old female here for evaluation of pseudotumor cerebri.  2006, patient had increasing headache, sensitivity to sound, blurred vision, had evaluation and lumbar puncture with elevated opening pressure.  Patient was diagnosed with pseudotumor cerebri and prescribed acetazolamide.  Patient did well and then she tapered off medication.  One year later symptoms returned and patient had another lumbar puncture.  By 2013 patient was pregnant and therefore could not take any additional medication.  In 2017 she had lumbar puncture due to worsening symptoms, increased weight (260 pounds) with opening pressure of 53 cm water.  Symptoms improved as patient lost some weight.  2019 patient was doing better until August when she had increasing headaches.  Patient has tried to improve nutrition again and symptoms are stable.  Patient reports generalized pressure headaches with sensitivity to sound in the setting of increased pressure in the past.  Patient also has intermittent right-sided throbbing headaches with nausea, photophobia, blurred  vision, lasting hours or days at a time.  These headaches can occur 3-4 times per week severe basis and 5 times per week on a mild basis.  Patient has family history of migraine in her aunt.  She was prescribed acetazolamide recently but could not afford due to excessive cost.   REVIEW OF SYSTEMS: Full 14 system review of systems performed and negative with exception of: as per HPI.   ALLERGIES: Allergies  Allergen Reactions   Penicillins Anaphylaxis    Has patient had a PCN reaction causing immediate rash, facial/tongue/throat swelling, SOB or lightheadedness with hypotension: Yes Has patient had a PCN reaction causing severe rash involving mucus membranes or skin necrosis: No Has patient had a PCN reaction that required hospitalization No Has patient had a PCN reaction occurring within the last 10 years: No If all of the above answers are "NO", then may proceed with Cephalosporin use.    Minocycline Other (See Comments)    Pseudo tumor cerebro    HOME MEDICATIONS: Outpatient Medications Prior to Visit  Medication Sig Dispense Refill   acetaminophen (TYLENOL) 325 MG tablet Take 2 tablets (650 mg total) by mouth every 6 (six) hours as needed for mild pain or fever. 30 tablet 0   omeprazole (PRILOSEC) 40 MG capsule Take 40 mg by mouth daily.     triamcinolone acetonide (TRIESENCE) 40 MG/ML SUSP 40 mg by Subtenons route.     BUPivacaine HCl 250 MG/8ML SOSY Inject 8 mLs as directed daily. (Patient not taking: Reported on 07/24/2023)     busPIRone (BUSPAR) 5 MG tablet Take 5 mg by mouth 2 (two) times daily. (Patient not taking: Reported on 07/24/2023)  famotidine (PEPCID) 40 MG tablet Take 40 mg by mouth daily. (Patient not taking: Reported on 07/24/2023)     ibuprofen (ADVIL) 600 MG tablet Take 1 tablet (600 mg total) by mouth every 6 (six) hours as needed for mild pain, fever or headache. (Patient not taking: Reported on 07/24/2023) 30 tablet 0   levofloxacin (LEVAQUIN) 500 MG tablet  Take 1 tablet (500 mg total) by mouth daily. (Patient not taking: Reported on 07/24/2023) 7 tablet 0   lidocaine (XYLOCAINE) 2 % solution Use as directed 15 mLs in the mouth or throat as needed for mouth pain. (Patient not taking: Reported on 07/24/2023) 100 mL 0   omeprazole (PRILOSEC) 20 MG capsule Take 20 mg by mouth 2 (two) times daily. (Patient not taking: Reported on 07/24/2023)     omeprazole (PRILOSEC) 20 MG capsule TAKE 1 CAPSULE (20 MG TOTAL) BY MOUTH 2 TIMES DAILY (Patient not taking: Reported on 07/24/2023)     ondansetron (ZOFRAN) 4 MG tablet Take 1 tablet (4 mg total) by mouth every 6 (six) hours. (Patient not taking: Reported on 07/24/2023) 12 tablet 0   No facility-administered medications prior to visit.    PAST MEDICAL HISTORY: 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)    got from the chicken pox as a child    Panic attacks    Pseudotumor cerebri     PAST SURGICAL HISTORY: Past Surgical History:  Procedure Laterality Date   addenoidectomy     LUMBAR PUNCTURE     PYLOROMYOTOMY     TONSILLECTOMY      FAMILY HISTORY: Family History  Problem Relation Age of Onset   Diabetes Paternal Grandfather    Cancer Paternal Grandmother    Diabetes Maternal Grandmother    Heart disease Maternal Grandmother    Osteoporosis Maternal Grandmother    Atrial fibrillation Maternal Grandfather    Asthma Father    Diabetes Mother    Asthma Sister    Mental illness Sister     SOCIAL HISTORY: Social History   Socioeconomic History   Marital status: Married    Spouse name: Not on file   Number of children: Not on file   Years of education: Not on file   Highest education  level: Not on file  Occupational History   Not on file  Tobacco Use   Smoking status: Never   Smokeless tobacco: Never  Vaping Use   Vaping status: Never Used  Substance and Sexual Activity   Alcohol use: No   Drug use: No   Sexual activity: Yes    Birth control/protection: None  Other Topics Concern   Not on file  Social History Narrative   Not on file   Social Determinants of Health   Financial Resource Strain: Medium Risk (07/21/2021)   Overall Financial Resource Strain (CARDIA)    Difficulty of Paying Living Expenses: Somewhat hard  Food Insecurity: No Food Insecurity (07/21/2021)   Hunger Vital Sign    Worried About Running Out of Food in the Last Year: Never true    Ran Out of Food in the Last Year: Never true  Transportation Needs: No Transportation Needs (07/21/2021)   PRAPARE - Transportation  Lack of Transportation (Medical): No    Lack of Transportation (Non-Medical): No  Physical Activity: Insufficiently Active (07/21/2021)   Exercise Vital Sign    Days of Exercise per Week: 2 days    Minutes of Exercise per Session: 20 min  Stress: Stress Concern Present (07/21/2021)   [employer] of Occupational Health - Occupational Stress Questionnaire    Feeling of Stress : Very much  Social Connections: Unknown (01/27/2022)   Received from [facility], [facility]   Social Network    Social Network: Not on file  Intimate Partner Violence: Unknown (12/30/2021)   Received from [facility], [facility]   HITS    Physically Hurt: Not on file    Insult or Talk Down To: Not on file    Threaten Physical Harm: Not on file    Scream or Curse: Not on file     PHYSICAL EXAM  GENERAL EXAM/CONSTITUTIONAL: Vitals:  Vitals:   07/24/23 0953  BP: 122/88  Pulse: 79  Weight: 169 lb 9.6 oz (76.9 kg)  Height: 4\' 11"  (1.499 m)   Body mass index is 34.26 kg/m. Wt Readings from Last 8 Encounters:  07/24/23 169 lb 9.6 oz (76.9 kg)  05/22/22 152 lb (68.9 kg)   04/20/22 145 lb (65.8 kg)  12/08/21 145 lb (65.8 kg)  07/21/21 141 lb (64 kg)  07/02/21 140 lb (63.5 kg)  08/18/20 158 lb 11.7 oz (72 kg)  07/04/18 184 lb (83.5 kg)   Patient is in no distress; well developed, nourished and groomed; neck is supple  CARDIOVASCULAR: Examination of carotid arteries is normal; no carotid bruits Regular rate and rhythm, no murmurs Examination of peripheral vascular system by observation and palpation is normal  EYES: Ophthalmoscopic exam of optic discs and posterior segments is normal; no papilledema or hemorrhages No results found.   MUSCULOSKELETAL: Gait, strength, tone, movements noted in Neurologic exam below  NEUROLOGIC: MENTAL STATUS:      No data to display         awake, alert, oriented to person, place and time recent and remote memory intact normal attention and concentration language fluent, comprehension intact, naming intact fund of knowledge appropriate  CRANIAL NERVE:  2nd - no papilledema on fundoscopic exam 2nd, 3rd, 4th, 6th - pupils equal and reactive to light, visual fields full to confrontation, extraocular muscles intact, no nystagmus 5th - facial sensation symmetric 7th - facial strength symmetric 8th - hearing intact 9th - palate elevates symmetrically, uvula midline 11th - shoulder shrug symmetric 12th - tongue protrusion midline  MOTOR:  normal bulk and tone, full strength in the BUE, BLE  SENSORY:  normal and symmetric to light touch, temperature, vibration  COORDINATION:  finger-nose-finger, fine finger movements normal  REFLEXES:  deep tendon reflexes present and symmetric  GAIT/STATION:  narrow based gait     DIAGNOSTIC DATA (LABS, IMAGING, TESTING) - I reviewed patient records, labs, notes, testing and imaging myself where available.  Lab Results  Component Value Date   WBC 7.6 04/20/2022   HGB 12.8 04/20/2022   HCT 39.0 04/20/2022   MCV 83.9 04/20/2022   PLT 165 04/20/2022       Component Value Date/Time   NA 137 04/20/2022 0801   NA 136 12/15/2012 1009   K 3.3 (L) 04/20/2022 0801   K 3.4 (L) 12/15/2012 1009   CL 104 04/20/2022 0801   CL 102 12/15/2012 1009   CO2 27 04/20/2022 0801   CO2 24 12/15/2012 1009   GLUCOSE  107 (H) 04/20/2022 0801   GLUCOSE 91 12/15/2012 1009   BUN 13 04/20/2022 0801   BUN 11 12/15/2012 1009   CREATININE 0.55 04/20/2022 0801   CREATININE 0.49 (L) 12/15/2012 1009   CALCIUM 8.9 04/20/2022 0801   CALCIUM 8.9 12/15/2012 1009   PROT 7.2 04/20/2022 0801   PROT 8.0 12/15/2012 1009   ALBUMIN 4.0 04/20/2022 0801   ALBUMIN 3.4 12/15/2012 1009   AST 19 04/20/2022 0801   AST 25 12/15/2012 1009   ALT 19 04/20/2022 0801   ALT 25 12/15/2012 1009   ALKPHOS 39 04/20/2022 0801   ALKPHOS 58 12/15/2012 1009   BILITOT 0.4 04/20/2022 0801   BILITOT 0.4 12/15/2012 1009   GFRNONAA >60 04/20/2022 0801   GFRNONAA >60 12/15/2012 1009   GFRAA >60 04/23/2018 1827   GFRAA >60 12/15/2012 1009   No results found for: "CHOL", "HDL", "LDLCALC", "LDLDIRECT", "TRIG", "CHOLHDL" No results found for: "HGBA1C" No results found for: "VITAMINB12" Lab Results  Component Value Date   TSH 2.568 04/23/2018    06/25/13 MRI brain [I reviewed images myself and agree with interpretation. -VRP]  - Physiologic prominence of the pituitary in the postpartum state. No evidence for apoplexy, subarachnoid hemorrhage, eclampsia, or other acute intracranial finding.  06/25/13 MRA head [I reviewed images myself and agree with interpretation. -VRP]  - unremarkable  06/25/18 MRV head [I reviewed images myself and agree with interpretation. -VRP]  - No evidence of venous sinus thrombosis. Normal variant hypoplastic proximal left transverse sinus.  04/15/23 MRI brain Unremarkable non-contrast MRI appearance of the brain. No evidence  of an acute intracranial abnormality.    ASSESSMENT AND PLAN  32 y.o. year old female here with history of pseudotumor cerebri, also with  headaches with migraine features.  Dx:  1. Blurred vision   2. Anxiety     PLAN:  VISION CHANGES (blurred; oscillopsia) - refer to ophthalmology evaluation (visual field testing; monitor for papilledema)  HISTORY OF idiopathic intracranial hypertension (pseudotumor cerebri)  - stable; may consider LP or acetazolamide after ophthalmology evaluation  LEFT FACIAL NUMBNESS - unclear etiology, neuro exam and MRI brain unremarkable  ANXIETY / PTSD - refer to psychology  Orders Placed This Encounter  Procedures   Ambulatory referral to Ophthalmology   Ambulatory referral to Psychology   Return for pending if symptoms worsen or fail to improve.    Suanne Marker, MD 07/24/2023, 11:36 AM Certified in Neurology, Neurophysiology and Neuroimaging  [facility] [address] [place], [place] [address] [phone]

## 2023-07-26 ENCOUNTER — Telehealth: Payer: Self-pay | Admitting: Diagnostic Neuroimaging

## 2023-07-26 NOTE — Telephone Encounter (Signed)
Ophthalmology referral faxed to [facility] (fax# [phone], phone# [phone])

## 2023-07-26 NOTE — Telephone Encounter (Signed)
Psychology referral faxed to [facility] (fax# [phone], phone# [phone])

## 2023-11-02 NOTE — Progress Notes (Signed)
 [facility] [place] [address] [place] [id number]  Completion of this note was preformed using DAX Artifical Intelligence software.   Carol [name] 07-28-1991 11/02/2023   Chief Complaint  Patient presents with  . flu like symptoms    Patient states last Thursday or Friday she had to call [facility] because her heart rate would not go down. It was in the 160s. She said it got back to the 120. She then started to get symptoms of sore throat, mucus, runny nose.   She says her face feels like it is on fire, her neck vein hurts, red patches. She said her BP has been high as well. She says that she has gerd but there is a tightness in her chest and there is a cramp on her left side. She also feels like there are bubbles in her chest.     HPI:   History of Present Illness The patient presents for evaluation of a viral illness.  She experienced an episode of tachycardia last Friday, characterized by a sudden increase in heart rate to the 160s, which persisted for approximately 20 minutes. This was accompanied by a sensation of chest fluttering. She sought emergency medical services, where her heart rate was reduced to the 120s and she was advised to undergo blood work. However, she did not proceed to the hospital. Her elevated heart rate persisted into the night and the following day, after which it began to improve. Concurrently, she developed symptoms suggestive of a sinus infection, including severe throat pain and nausea. She also noticed an increase in the frequency of red spots on her face, which felt unusually hot and expansive. She reported experiencing pain in her neck veins and a blood pressure reading with a diastolic value of 103. She was questioned about potential autoimmune conditions, including POTS, but she has not been diagnosed with these. She reported mild chest tightness, which she attributed to her  GERD, and a sensation of bubbles in her chest. She has not had a fever but reported feeling unwell. She also reported a persistent cramp in her back that started earlier today. She described her neck as sore, with tenderness on both sides yesterday, now localized to one side. She expressed concern about her blood pressure, which she felt was elevated despite being at home and inactive. She questioned whether further cardiac evaluation was necessary.   Current Outpatient Medications  Medication Sig Dispense Refill  . acetaminophen  (TYLENOL ) 325 mg tablet Take 650 mg by mouth.    . famotidine (PEPCID) 40 mg tablet Take 1 tablet (40 mg total) by mouth daily. 90 tablet 3  . ferrous sulfate 325 mg (65 mg iron) EC tablet Take 1 tablet (325 mg total) by mouth daily with breakfast. 90 tablet 3  . omeprazole (PriLOSEC) 40 mg DR capsule      No current facility-administered medications for this visit.   Allergies  Allergen Reactions  . Penicillins Anaphylaxis and Itching    Has patient had a PCN reaction causing immediate rash, facial/tongue/throat swelling, SOB or lightheadedness with hypotension: Yes, Has patient had a PCN reaction causing severe rash involving mucus membranes or skin necrosis: No, Has patient had a PCN reaction that required hospitalization No, Has patient had a PCN reaction occurring within the last 10 years: No, If all of the above answers are NO, then may proceed with Cephalosporin use.  . Minocycline Other (See Comments)    Pseudo  tumor cerebro      The following portions of the patient's history were reviewed and updated as appropriate: allergies, current medications, PFH, PMH, past social history, past surgical history and problem list.    Review of Systems: Review of Systems  Constitutional: Positive for fever and malaise/fatigue. Negative for chills.  HENT:  Positive for congestion and sore throat.   Eyes:  Negative for blurred vision.  Cardiovascular:  Negative for  chest pain, dyspnea on exertion, palpitations and syncope.  Respiratory:  Positive for cough. Negative for shortness of breath.   Skin:  Positive for rash.  Musculoskeletal:  Positive for neck pain. Negative for back pain.  Gastrointestinal:  Negative for abdominal pain, constipation, diarrhea and vomiting.  Genitourinary:  Negative for flank pain and hematuria.  Neurological:  Negative for dizziness, headaches and weakness.      Physical Exam  Vitals:   11/02/23 1634  BP: 128/82  Pulse: 90  Temp: 98.7 F (37.1 C)  TempSrc: Oral  SpO2: 95%  Weight: 73.4 kg (161 lb 12.8 oz)    GENERAL APPEARANCE: 33 y.o., White or Caucasian,  Physical Exam Vitals and nursing note reviewed.  Constitutional:      General: She is not in acute distress.    Appearance: Normal appearance. She is normal weight.  HENT:     Head: Normocephalic.     Nose: Nose normal.  Eyes:     Extraocular Movements: Extraocular movements intact.     Pupils: Pupils are equal, round, and reactive to light.  Cardiovascular:     Rate and Rhythm: Normal rate and regular rhythm.  Pulmonary:     Effort: Pulmonary effort is normal.     Breath sounds: No wheezing.  Abdominal:     General: Abdomen is flat.     Palpations: Abdomen is soft.  Musculoskeletal:        General: Normal range of motion.     Cervical back: Normal range of motion.  Skin:    General: Skin is warm and dry.  Neurological:     General: No focal deficit present.     Mental Status: She is alert and oriented to person, place, and time. Mental status is at baseline.  Psychiatric:        Mood and Affect: Mood normal.        Behavior: Behavior normal.        Thought Content: Thought content normal.        Judgment: Judgment normal.            Diagnosis:  1. Viral illness          Plan:  Assessment & Plan 1. Viral illness. Her symptoms, including tachycardia, sore throat, and malaise, are consistent with a viral illness. The  presence of mucus in her lungs may be contributing to the sensation of bubbling, but her lung function appears normal. Her blood pressure readings have been within normal limits during this visit, suggesting that previous elevations were likely due to anxiety or panic attacks. Her heart function is also normal, with no evidence of blockages or turbulent blood flow. The coughing with the chills will go away within probably another day or two, but the coughing and the congestion is just lingering. She was advised to manage her symptoms with over-the-counter medications such as Tylenol  and ibuprofen , and to maintain adequate hydration. She was reassured that her blood pressure is currently stable and does not require any intervention at this time. If she experiences a  fever or difficulty breathing, she should inform us  immediately for further evaluation.  Results     No orders of the defined types were placed in this encounter.   No orders of the defined types were placed in this encounter.     No follow-ups on file.    I discussed with patient warning signs and symptoms and seeking Emergency Room care as needed.  This document was created using the aid of voice recognition Scientist, clinical (histocompatibility and immunogenetics).     This document serves as a record of services personally performed by Ole Ellen DNP, FNP-C.   Electronically signed by Ole Glendia Ellen, DNP 11/02/2023 4:49 PM

## 2024-02-06 ENCOUNTER — Ambulatory Visit (HOSPITAL_COMMUNITY)
Admission: RE | Admit: 2024-02-06 | Discharge: 2024-02-06 | Disposition: A | Discharge status: Home / Self Care | Source: Ambulatory Visit | Attending: Vascular Surgery | Admitting: Vascular Surgery

## 2024-02-06 ENCOUNTER — Other Ambulatory Visit (HOSPITAL_COMMUNITY): Payer: Self-pay | Admitting: Orthopedic Surgery

## 2024-02-06 DIAGNOSIS — M79605 Pain in left leg: Secondary | ICD-10-CM | POA: Diagnosis present

## 2024-02-16 ENCOUNTER — Ambulatory Visit: Admitting: Internal Medicine

## 2024-04-30 ENCOUNTER — Ambulatory Visit: Attending: Internal Medicine | Admitting: Internal Medicine

## 2024-04-30 NOTE — Progress Notes (Signed)
 Erroneous encounter - please disregard.

## 2024-05-21 ENCOUNTER — Ambulatory Visit: Admitting: Adult Health

## 2024-05-22 ENCOUNTER — Encounter: Payer: Self-pay | Admitting: Family Medicine

## 2024-06-07 ENCOUNTER — Encounter: Payer: Self-pay | Admitting: Family Medicine

## 2024-06-18 ENCOUNTER — Encounter: Payer: Self-pay | Admitting: Family Medicine

## 2024-06-25 ENCOUNTER — Ambulatory Visit
Admission: EM | Admit: 2024-06-25 | Discharge: 2024-06-25 | Disposition: A | Discharge status: Home / Self Care | Attending: Nurse Practitioner | Admitting: Nurse Practitioner

## 2024-06-25 ENCOUNTER — Ambulatory Visit (INDEPENDENT_AMBULATORY_CARE_PROVIDER_SITE_OTHER)

## 2024-06-25 ENCOUNTER — Encounter: Payer: Self-pay | Admitting: Emergency Medicine

## 2024-06-25 DIAGNOSIS — M79675 Pain in left toe(s): Secondary | ICD-10-CM | POA: Diagnosis not present
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
## 2024-06-25 NOTE — Discharge Instructions (Addendum)
 The x-ray today does not show any broken bones.  Your left pinky toe may be sprained.  You can use buddy tape to help immobilize the left pinky toe and help it to feel better.  You can also take Tylenol  or ibuprofen  as needed.  Seek care if symptoms do not improve or worsen despite treatment.
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
## 2024-07-12 ENCOUNTER — Ambulatory Visit: Admitting: Cardiovascular Disease

## 2024-07-23 ENCOUNTER — Other Ambulatory Visit: Payer: Self-pay

## 2024-07-23 ENCOUNTER — Encounter: Payer: Self-pay | Admitting: Emergency Medicine

## 2024-07-23 ENCOUNTER — Ambulatory Visit (INDEPENDENT_AMBULATORY_CARE_PROVIDER_SITE_OTHER)

## 2024-07-23 ENCOUNTER — Ambulatory Visit: Admission: EM | Admit: 2024-07-23 | Discharge: 2024-07-23 | Disposition: A | Discharge status: Home / Self Care

## 2024-07-23 DIAGNOSIS — R221 Localized swelling, mass and lump, neck: Secondary | ICD-10-CM

## 2024-07-23 DIAGNOSIS — R09A2 Foreign body sensation, throat: Secondary | ICD-10-CM

## 2024-07-23 NOTE — Discharge Instructions (Signed)
 Soft tissue neck x-ray was normal which is very reassuring but as discussed I recommend following up with your primary care provider for further evaluation if not resolving.  Go to the emergency department if your symptoms worsen at any time

## 2024-07-23 NOTE — ED Triage Notes (Signed)
 Pt reports sore throat,voice hoarseness,chills x3 days. Pt reports had wisdom teeth removed on Friday. Pt reports intermittent fuzzy feeling in throat. Has been taking medication prescribed post procedure. Reports intermittent discomfort in mouth and throat. Was px abx and prednisone. Finished abx on Sunday evening, one dose of prednisone.

## 2024-07-26 NOTE — ED Provider Notes (Signed)
 [facility]    CSN: [id number] Arrival date & time: 07/23/24  1219      History   Chief Complaint Chief Complaint  Patient presents with   Sore Throat    HPI Carol [name] is a 33 y.o. female.   Patient presenting today with 3-day history of sensation of lump in throat in the mid neck region, hoarseness.  She denies difficulty breathing or swallowing, fevers, chest tightness, shortness of breath, vomiting, abdominal pain, diarrhea.  Had 2 wisdom teeth removed on Friday and was given a course of antibiotics and prednisone which she is just now completing.  She denies any dietary or medication changes otherwise.  Has been eating soft foods overall since procedure but just started eating solids yesterday.  Swallowing without difficulty per patient.    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  Authorizing Provider  clindamycin  (CLEOCIN ) 300 MG capsule Take 300 mg by mouth 2 (two) times daily. 07/15/24  Yes [provider]  cyclobenzaprine (FLEXERIL) 5 MG tablet Take 5 mg by mouth 3 (three) times daily as needed. 07/19/24  Yes [provider]  methylPREDNISolone (MEDROL DOSEPAK) 4 MG TBPK tablet SMARTSIG:- Tablet(s) By Mouth - 07/19/24  Yes [provider]  acetaminophen  (TYLENOL ) 325 MG tablet Take 2 tablets (650 mg total) by mouth every 6 (six) hours as needed for mild pain or fever. 04/20/22   Elnor Hila P, DO  omeprazole (PRILOSEC) 40 MG capsule Take 40 mg by mouth daily.    [provider]    Family History Family History  Problem Relation Age of Onset   Diabetes Paternal Grandfather    Cancer Paternal Grandmother    Diabetes Maternal Grandmother    Heart disease Maternal Grandmother    Osteoporosis Maternal Grandmother    Atrial fibrillation Maternal Grandfather    Asthma Father    Diabetes Mother    Asthma Sister    Mental illness Sister     Social History Social History   Tobacco Use   Smoking status: Never  Smokeless tobacco: Never  Vaping Use   Vaping status: Never Used  Substance Use Topics   Alcohol use: No   Drug use: No     Allergies   Penicillins and Minocycline   Review of Systems Review of Systems PER HPI  Physical Exam Triage Vital Signs ED Triage Vitals  Encounter Vitals Group     BP 07/23/24 1241 134/85     Girls Systolic BP Percentile --      Girls Diastolic BP Percentile --      Boys Systolic BP Percentile --      Boys Diastolic BP Percentile --      Pulse Rate 07/23/24 1241 77     Resp 07/23/24 1241 18     Temp 07/23/24 1241 98.9 F (37.2 C)     Temp Source 07/23/24 1241 Oral     SpO2 07/23/24 1241 98 %     Weight --      Height --      Head Circumference --      Peak Flow --      Pain Score 07/23/24 1237 2     Pain Loc --      Pain Education --      Exclude from Growth Chart --     No data found.  Updated Vital Signs BP 134/85 (BP Location: Right Arm)   Pulse 77   Temp 98.9 F (37.2 C) (Oral)   Resp 18   LMP 07/07/2024 (Approximate)   SpO2 98%   Visual Acuity Right Eye Distance:   Left Eye Distance:   Bilateral Distance:    Right Eye Near:   Left Eye Near:    Bilateral Near:     Physical Exam Vitals and nursing note reviewed.  Constitutional:      Appearance: Normal appearance. She is not ill-appearing.  HENT:     Head: Atraumatic.     Nose: Nose normal.     Mouth/Throat:     Mouth: Mucous membranes are moist.     Pharynx: Oropharynx is clear. No posterior oropharyngeal erythema.  Eyes:     Extraocular Movements: Extraocular movements intact.     Conjunctiva/sclera: Conjunctivae normal.  Neck:     Comments: No thyromegaly Cardiovascular:     Rate and Rhythm: Normal rate.  Pulmonary:     Effort: Pulmonary effort is normal.  Musculoskeletal:        General: Normal range of motion.     Cervical back: Normal range of motion and neck supple. No tenderness.  Lymphadenopathy:     Cervical: No cervical adenopathy.  Skin:    General: Skin is warm and dry.  Neurological:     Mental Status: She is alert and oriented to person, place, and time.  Psychiatric:        Mood and Affect: Mood normal.        Thought Content: Thought content normal.        Judgment: Judgment normal.      UC Treatments / Results  Labs (all labs ordered are listed, but only abnormal results are displayed) Labs Reviewed - No data to display  EKG   Radiology No results found.  Procedures Procedures (including critical care time)  Medications Ordered in UC Medications - No data to display  Initial Impression / Assessment and Plan / UC Course  I have reviewed the triage vital signs and the nursing notes.  Pertinent labs & imaging results that were available during my care of the  patient were reviewed by me and considered in my medical decision making (see  chart for details).     Vital signs and exam very reassuring today, she is well-appearing and in no acute distress and breathing and swallowing without difficulty.  Soft tissue neck x-ray performed and this was without abnormality, reassurance given to patient but did discuss the limitations of this exam and that if symptoms persisted or worsen she should go to the emergency department, or at minimum follow-up with primary care for further evaluation.  Very low suspicion for infectious cause of symptoms at this time so we will forego extending antibiotics.  Continue following oral surgery recommendations and follow-up as scheduled.  Final Clinical Impressions(s) / UC Diagnoses   Final diagnoses:  Sensation of lump in throat     Discharge Instructions      Soft tissue neck x-ray was normal which is very reassuring but as discussed I recommend following up with your primary care provider for further evaluation if not resolving.  Go to the emergency department if your symptoms worsen at any time    ED Prescriptions   None    PDMP not reviewed this encounter.   Stuart Millman [facility] [place], [place] 07/26/24 [phone]

## 2024-08-04 NOTE — Progress Notes (Signed)
 " Cardiology Office Note   Date:  08/07/2024  ID:  Carol [name], DOB Oct 12, 1990, MRN [id number] PCP: Almarie Oneil BROCKS., MD  [facility] Providers Cardiologist:  None Electrophysiologist:  Donnice DELENA Primus, MD    History of Present Illness Carol [name] is a 33 y.o. 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   She is previously seen by Dr. Delford on 05/01/18 for palpitations and cardiac event monitor was sent which was unremarkable for any arrhythmias. History of Present Illness Carol [name] is a 33 year old female who presents with debilitating symptoms including palpitations and presyncope.  She has a long-standing history of palpitations, presyncope, and significant fatigue. Episodes are triggered by standing, causing her heart to feel like it's 'beating out of my chest,' accompanied by redness and swelling in her legs, and a sensation of impending fainting. These symptoms have been present for several years and have become increasingly debilitating, affecting her ability to perform daily activities such as cooking and standing for extended periods.  She has a history of self-reported autoimmune issues, though the specific condition remains undiagnosed. She has not been officially tested for POTS due to a long wait list at [facility].  She has also been told that she may have hEDS based on her sx. Some of her symptoms have been persistent since childhood, with her mother recalling complaints of pain and palpitations from a young age. She also experienced necrotizing fasciitis at age 21, which her mother believes may have contributed to her ongoing health issues.  Current symptoms include episodes of tachycardia, particularly when standing still, and a sensation of her heart skipping beats. Severe fatigue often leaves her feeling as though she cannot keep her eyes open, impacting her  ability to work and care for her children. Physical activity exacerbates her symptoms, leading to prolonged recovery times and flu-like symptoms.  She has undergone various diagnostic tests, including EKGs, echo and heart monitoring. She has been on omeprazole for GERD for several years and has tried Buspar for anxiety without benefit. She is hesitant to take medications due to past adverse reactions, including developing pseudotumor cerebri from minocycline.  Her social history includes working from home and caring for two children, ages 49 and twelve. She follows a diet consisting of meats, healthy fats, and vegetables, and has experienced significant weight fluctuations over the years. She attributes some of her weight gain to decreased physical activity and depression following a period of high activity and weight loss.  In the review of symptoms, she denies anxiety as the primary cause of her symptoms, although she acknowledges that her condition causes anxiety. She reports being heat intolerant and has tried compression garments with limited success. Her resting heart rate is typically around 68 bpm, but it can rise to 120 bpm upon standing.  ROS: palpitations, fatigue  Studies Reviewed  ECG review 12/08/21: NSR 76, PR 90, QRS 87, QT/c 385/433 08/18/20: NSR 74, PR 136, QRS 78, QT/c 394/437 04/23/18: NSR 85, PR 118, QRS 76, QT/c 384/456 05/17/17: NSR 74, PR 130, QRS 86, QT/c 392/435 10/30/15: NSR 79, PR 126, QRS 78, QT/c 376/431 10/14/15: NSR 93, PR 128, QRS 86, QT/c 372/462 10/10/15: ST 103, PR 131, QRS 86, QT/c 336/440 10/09/15:  NSR 83, PR 134, QRS 89, QT/c 371/436 06/10/13: NSR 90, PR 116, QRS 92, QT/c 374/457 12/15/12: ST 112, PR 144, QRS 82, QT/c 340/464 06/25/10: NSR 76, PR  122, QRS 90, QT/c 386/434  Physical Exam VS:  BP 116/70 (BP Location: Left Arm, Cuff Size: Normal)   Pulse 75   Ht 4' 11 (1.499 m)   Wt 164 lb 6.4 oz (74.6 kg)   LMP 07/07/2024 (Approximate)   SpO2  98%   BMI 33.20 kg/m       Wt Readings from Last 3 Encounters:  08/06/24 164 lb 6.4 oz (74.6 kg)  07/24/23 169 lb 9.6 oz (76.9 kg)  05/22/22 152 lb (68.9 kg)    GEN: Well nourished, well developed in no acute distress CARDIAC: RRR, no murmurs, rubs, gallops RESPIRATORY:  Clear to auscultation without rales, wheezing or rhonchi  EXTREMITIES:  No edema; No deformity   ASSESSMENT AND PLAN Carol [name] is a 33 y.o. 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   POTS We had a long discussion today about her symptoms and her evaluation at this point which has been reassuring.  She continues to have debilitating symptoms despite extensive evaluation however has not tried many interventions.  She has tried both lower extremity stockings and some increased fluid intake along with electrolytes.  She is going to increase her fluid intake to 4 L daily.  She currently is drinking around 5-6 bottles of water a day.  I would recommend that she consider a nonselective beta-blocker such as propranolol or nadolol as a next step in treatment along with increased salt intake.  She was reluctant to start medications today but said she would consider this in the future. 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 She has trialed gabapentin and SSRI in the past but discontinued as she felt they were not helpful. She reports multiple medication intolerances and side effects to medications.  I explained that increased fluid and salt intake alone likely will not be adequate to alleviate her symptoms with the degree of severity she is reporting. Her main sx she wants to improve is the palpitations/tachycardia and general malaise associated with this. We reviewed that she felt dramatically better when she was in better physical shape and exercising regularly with wt around 130 lb. She is 165 lb today. Provided Levine  protocol and CHOP protocol with goal of losing at least 10 lbs by our next visit. Prescription for abdominal binder provided. Would be beneficial to have her evaluated at [facility] in the POTS clinic along with TTT to better delineate her symptoms.       Dispo: RTC 4 months  A total of 65 minutes was spent preparing for the patient, reviewing history, performing exam, document encounter, coordinating care and counseling the patient. 48 minutes was spent with direct patient care.   Signed, Donnice DELENA Primus, MD  "

## 2024-08-05 NOTE — Progress Notes (Signed)
 Aleisha Paone Rodriguez-Ventura 02-Nov-1990 08/05/2024 Vitals:   08/05/24 1112  BP: 128/80  Pulse: 63  SpO2: 100%  Weight: 74.9 kg (165 lb 3.2 oz)  Height: 1.499 m (4' 11)    Chief Complaint  Patient presents with  . UC follow up     States that she seen [facility]- had wisdom tooth removal and she started having said large nodule in back of throat, hair- states that had xray done. Patient states that this has bene ongoing for about 3 weeks. States that she has Hoarseness, Gerd/acid reflux. Wondering if she is needing US .  . Auto Immune Concerns    HPI:   UC Follow-up-patient presents today for recent urgent care follow-up she reports that she was seen at [facility] urgent care 10/28 2025 after having wisdom teeth extraction a few weeks prior she reports that she felt a sensation of a lump in her throat then going on for 3 weeks she reports that she is also having voice hoarseness and history of acid reflux she reports that she is currently taken omeprazole 40 mg daily not sure if it is really helping.  She did have x-ray of her neck obtained at urgent care but does still like she needs further imaging as she still feels like there is a lump in her throat.   She also would like to discuss obtaining further lab work for possible autoimmune disorder she reports that she has been worked up in the past however she reports that her symptoms continue and are still persistent she has seen specialist for workup for POTS however still has no answers she reports that she has noticed change in her vision neck pain fatigue chronic pain she has had multiple ligament injuries change at heart rate is concern for possible Ehlers-Danlos unsure if she is hypermobile joints.  Also reports that she is unsure if this could be her pseudotumor but could be contributing to her symptoms.   Current Medications[1] Allergies[2]    The following portions of the patient's history were reviewed and updated as  appropriate: allergies, current medications, PFH, PMH, past social history, past surgical history and problem list.    Review of Systems  Constitutional:  Positive for fatigue. Negative for fever.  HENT:  Positive for voice change. Negative for congestion.        Lump in throat sensation   Eyes:  Positive for visual disturbance.  Respiratory:  Negative for chest tightness.   Cardiovascular:  Positive for palpitations. Negative for chest pain and leg swelling.  Gastrointestinal: Negative.   Endocrine: Negative.   Genitourinary: Negative.   Musculoskeletal:  Positive for arthralgias.  Skin: Negative.   Allergic/Immunologic: Negative.   Neurological:  Negative for dizziness, weakness and headaches.  Hematological: Negative.   Psychiatric/Behavioral: Negative.       Physical Exam  Vitals:   08/05/24 1112  BP: 128/80  Pulse: 63  SpO2: 100%  Weight: 74.9 kg (165 lb 3.2 oz)  Height: 1.499 m (4' 11)    Physical Exam Vitals and nursing note reviewed.  Constitutional:      Appearance: Normal appearance.  HENT:     Head: Normocephalic and atraumatic.     Right Ear: Tympanic membrane, ear canal and external ear normal.     Left Ear: Ear canal and external ear normal.     Nose: Congestion and rhinorrhea present.     Mouth/Throat:     Mouth: Mucous membranes are moist.     Pharynx: Oropharynx  is clear. Posterior oropharyngeal erythema present.  Eyes:     Pupils: Pupils are equal, round, and reactive to light.  Cardiovascular:     Rate and Rhythm: Normal rate and regular rhythm.     Pulses: Normal pulses.     Heart sounds: Normal heart sounds.  Pulmonary:     Effort: Pulmonary effort is normal.     Breath sounds: Normal breath sounds.  Musculoskeletal:        General: Normal range of motion.     Cervical back: Normal range of motion and neck supple.  Skin:    General: Skin is warm and dry.  Neurological:     General: No focal deficit present.     Mental Status: She is  alert and oriented to person, place, and time.  Psychiatric:        Mood and Affect: Mood normal.        Behavior: Behavior normal.     Assessment/Plan:   1. Globus sensation  CT Soft Tissue Neck WO Contrast    2. Voice hoarseness  sodium chloride  (Saline NasaL) 0.65 % nasal spray   fexofenadine (Allegra Allergy) 180 mg tablet   CT Soft Tissue Neck WO Contrast    3. Gastroesophageal reflux disease without esophagitis  pantoprazole (PROTONIX) 40 mg EC tablet   CT Soft Tissue Neck WO Contrast    4. PND (post-nasal drip)  sodium chloride  (Saline NasaL) 0.65 % nasal spray   fexofenadine (Allegra Allergy) 180 mg tablet    5. Pseudotumor cerebri      6. Autoimmune disease (CMD)  Vitamin B12   Antinuclear Antibody, Hep-2 Substrate,IgG   Lupus Anticoagulant Screen With Reflex   Sedimentation Rate (ESR)   C-Reactive Protein (CRP)   Rheumatoid Factor   TSH With Reflex To Free T4   Anemia Profile   Comprehensive Metabolic Panel   CBC with Differential   Vitamin D, 25-Hydroxy   Sjogren's Antibodies Anti-SS-B   Sjogren's Antibodies Anti-SS-A   Vitamin B12   Antinuclear Antibody, Hep-2 Substrate,IgG   Lupus Anticoagulant Screen With Reflex   Sedimentation Rate (ESR)   C-Reactive Protein (CRP)   Rheumatoid Factor   TSH With Reflex To Free T4   Anemia Profile   Comprehensive Metabolic Panel   CBC with Differential   Vitamin D, 25-Hydroxy   Sjogren's Antibodies Anti-SS-B   Sjogren's Antibodies Anti-SS-A    7. POTS (postural orthostatic tachycardia syndrome)      8. Blurry vision       Reassurance given advised patient as she has had x-ray we will obtain CT of the neck for further evaluation management will change her omeprazole to Protonix encouraged to monitor for worsening triggers to avoid will start Allegra and saline nasal spray for postnasal drip we will have her obtain autoimmune labs and follow-up with results.  Discussed POTS with patient provided her with contact  information for better brain and body specialist in [place]  for further evaluation management.  Return if symptoms worsen or fail to improve.    Electronically signed by Ole Mannie Pepper, FNP 08/05/2024 11:25 AM        [1] Current Outpatient Medications  Medication Sig Dispense Refill  . acetaminophen  (TYLENOL ) 325 mg tablet Take 650 mg by mouth.    . famotidine (PEPCID) 40 mg tablet Take 1 tablet (40 mg total) by mouth daily. 90 tablet 3  . ferrous sulfate 325 mg (65 mg iron) EC tablet Take 1 tablet (325 mg total) by mouth  daily with breakfast. 90 tablet 3  . fexofenadine (Allegra Allergy) 180 mg tablet Take 1 tablet (180 mg total) by mouth daily. 90 tablet 3  . pantoprazole (PROTONIX) 40 mg EC tablet Take 1 tablet (40 mg total) by mouth every morning before breakfast. 90 tablet 3  . sodium chloride  (Saline NasaL) 0.65 % nasal spray Administer 1 spray into each nostril every 2 (two) hours as needed for congestion. 50 mL 3   No current facility-administered medications for this visit.  [2] Allergies Allergen Reactions  . Penicillins Anaphylaxis and Itching    Has patient had a PCN reaction causing immediate rash, facial/tongue/throat swelling, SOB or lightheadedness with hypotension: Yes, Has patient had a PCN reaction causing severe rash involving mucus membranes or skin necrosis: No, Has patient had a PCN reaction that required hospitalization No, Has patient had a PCN reaction occurring within the last 10 years: No, If all of the above answers are NO, then may proceed with Cephalosporin use.  . Soy Rash  . Minocycline Other (See Comments)    Pseudo tumor cerebro

## 2024-08-06 ENCOUNTER — Ambulatory Visit
Attending: Student in an Organized Health Care Education/Training Program | Admitting: Student in an Organized Health Care Education/Training Program

## 2024-08-06 ENCOUNTER — Encounter: Payer: Self-pay | Admitting: Student in an Organized Health Care Education/Training Program

## 2024-08-06 VITALS — BP 116/70 | HR 75 | Ht 59.0 in | Wt 164.4 lb

## 2024-08-06 DIAGNOSIS — Z136 Encounter for screening for cardiovascular disorders: Secondary | ICD-10-CM

## 2024-08-06 DIAGNOSIS — G90A Postural orthostatic tachycardia syndrome (POTS): Secondary | ICD-10-CM

## 2024-08-06 MED ORDER — ABDOMINAL BINDER/ELASTIC LARGE MISC: 1.0000 [IU] | Freq: Every day | 0 refills | Status: AC

## 2024-08-06 NOTE — Patient Instructions (Signed)
 Medication Instructions:  Your physician recommends that you continue on your current medications as directed. Please refer to the Current Medication list given to you today.  *If you need a refill on your cardiac medications before your next appointment, please call your pharmacy*  Lab Work: NONE   If you have labs (blood work) drawn today and your tests are completely normal, you will receive your results only by: MyChart Message (if you have MyChart) OR A paper copy in the mail If you have any lab test that is abnormal or we need to change your treatment, we will call you to review the results.  Testing/Procedures: NONE   Follow-Up: At [facility], you and your health needs are our priority.  As part of our continuing mission to provide you with exceptional heart care, our providers are all part of one team.  This team includes your primary Cardiologist (physician) and Advanced Practice Providers or APPs (Physician Assistants and Nurse Practitioners) who all work together to provide you with the care you need, when you need it.  Your next appointment:    March   Provider:   Donnice Primus, MD    We recommend signing up for the patient portal called MyChart.  Sign up information is provided on this After Visit Summary.  MyChart is used to connect with patients for Virtual Visits (Telemedicine).  Patients are able to view lab/test results, encounter notes, upcoming appointments, etc.  Non-urgent messages can be sent to your provider as well.   To learn more about what you can do with MyChart, go to [email or web address].   Other Instructions Thank you for choosing [facility]!

## 2024-08-19 ENCOUNTER — Telehealth: Payer: Self-pay | Admitting: Student in an Organized Health Care Education/Training Program

## 2024-08-19 NOTE — Telephone Encounter (Signed)
 Patient is calling because she had labs completed with her PCP. Patient would like Dr. Almetta to review the lab results as it could be the missing piece. Patient would like to know if Dr. Almetta could place a referral to Hematology due to having issues troubles with PCP. Patient is requesting a call back. Please advise.

## 2024-08-19 NOTE — Telephone Encounter (Signed)
 Pt called regarding lab work previously ordered by PCP. Pt wanting results and Hematology referral. Advised pt to follow-up with PCP and to reach back out to them for further advisement. Will notify Dr Almetta and make him aware. Pt verbalized understanding.

## 2024-08-29 ENCOUNTER — Other Ambulatory Visit: Payer: Self-pay

## 2024-08-29 ENCOUNTER — Emergency Department (HOSPITAL_COMMUNITY)
Admission: EM | Admit: 2024-08-29 | Discharge: 2024-08-30 | Disposition: A | Attending: Emergency Medicine | Admitting: Emergency Medicine

## 2024-08-29 DIAGNOSIS — K625 Hemorrhage of anus and rectum: Secondary | ICD-10-CM

## 2024-08-29 DIAGNOSIS — E876 Hypokalemia: Secondary | ICD-10-CM

## 2024-08-29 NOTE — ED Triage Notes (Addendum)
 Patient reports intermittent bloody stools for several weeks , no abdominal pain , denies fever or emesis . She is not taking anticoagulant medication .

## 2024-08-30 LAB — COMPREHENSIVE METABOLIC PANEL WITH GFR
ALT: 23 U/L (ref 0–44)
AST: 23 U/L (ref 15–41)
Albumin: 3.6 g/dL (ref 3.5–5.0)
Alkaline Phosphatase: 37 U/L — ABNORMAL LOW (ref 38–126)
Anion gap: 10 (ref 5–15)
BUN: 16 mg/dL (ref 6–20)
CO2: 25 mmol/L (ref 22–32)
Calcium: 9 mg/dL (ref 8.9–10.3)
Chloride: 104 mmol/L (ref 98–111)
Creatinine, Ser: 0.56 mg/dL (ref 0.44–1.00)
GFR, Estimated: >60 mL/min (ref >60)
Glucose, Bld: 94 mg/dL (ref 70–99)
Potassium: 3.4 mmol/L — ABNORMAL LOW (ref 3.5–5.1)
Sodium: 139 mmol/L (ref 135–145)
Total Bilirubin: 0.9 mg/dL (ref 0.0–1.2)
Total Protein: 7.5 g/dL (ref 6.5–8.1)

## 2024-08-30 LAB — URINALYSIS, ROUTINE W REFLEX MICROSCOPIC
Bilirubin Urine: NEGATIVE
Glucose, UA: NEGATIVE mg/dL
Ketones, ur: 20 mg/dL — AB
Leukocytes,Ua: NEGATIVE
Nitrite: NEGATIVE
Protein, ur: 100 mg/dL — AB
RBC / HPF: >50 RBC/hpf (ref 0–5)
Specific Gravity, Urine: 1.03 (ref 1.005–1.030)
pH: 5 (ref 5.0–8.0)

## 2024-08-30 LAB — CBC WITH DIFFERENTIAL/PLATELET
Abs Immature Granulocytes: 0.02 K/uL (ref 0.00–0.07)
Basophils Absolute: 0.1 K/uL (ref 0.0–0.1)
Basophils Relative: 1 %
Eosinophils Absolute: 0.1 K/uL (ref 0.0–0.5)
Eosinophils Relative: 2 %
HCT: 29.8 % — ABNORMAL LOW (ref 36.0–46.0)
Hemoglobin: 8.3 g/dL — ABNORMAL LOW (ref 12.0–15.0)
Immature Granulocytes: 0 %
Lymphocytes Relative: 39 %
Lymphs Abs: 2.5 K/uL (ref 0.7–4.0)
MCH: 17.6 pg — ABNORMAL LOW (ref 26.0–34.0)
MCHC: 27.9 g/dL — ABNORMAL LOW (ref 30.0–36.0)
MCV: 63.3 fL — ABNORMAL LOW (ref 80.0–100.0)
Monocytes Absolute: 0.8 K/uL (ref 0.1–1.0)
Monocytes Relative: 12 %
Neutro Abs: 2.9 K/uL (ref 1.7–7.7)
Neutrophils Relative %: 46 %
Platelets: 235 K/uL (ref 150–400)
RBC: 4.71 MIL/uL (ref 3.87–5.11)
RDW: 19.9 % — ABNORMAL HIGH (ref 11.5–15.5)
Smear Review: NORMAL
WBC: 6.3 K/uL (ref 4.0–10.5)
nRBC: 0 % (ref 0.0–0.2)

## 2024-08-30 LAB — HCG, SERUM, QUALITATIVE: Preg, Serum: NEGATIVE

## 2024-08-30 LAB — POC OCCULT BLOOD, ED: Fecal Occult Bld: POSITIVE — AB

## 2024-08-30 LAB — SAMPLE TO BLOOD BANK

## 2024-08-30 MED ORDER — FERROUS GLUCONATE 324 (38 FE) MG PO TABS: 324.0000 mg | ORAL_TABLET | Freq: Every day | ORAL | 1 refills | Status: AC

## 2024-08-30 MED ORDER — POTASSIUM CHLORIDE CRYS ER 10 MEQ PO TBCR: 10.0000 meq | EXTENDED_RELEASE_TABLET | Freq: Every day | ORAL | 0 refills | Status: DC

## 2024-08-30 NOTE — Discharge Instructions (Addendum)
 I have given you information for two gastroenterology offices Please call and see who can see you first.  I have written you a prescription for an iron supplement to take daily if you notice that it is causing some GI upset then you can take every other day if this is not inferior.  You can also use the gentle iron that you have already.  From my research it seems that this is noninferior to iron sulfate.  I also recommend fiber.  Increase your dietary fiber but you can use synthetic fiber or psyllium husk as well  Your potassium is also slightly low.  I have given you some potassium supplements to take.  Have your potassium rechecked when you follow-up with your new primary care provider :)

## 2024-08-30 NOTE — Medical Student Note (Signed)
 [facility]EM[facility]ENCY DEPT Provider Student Note For educational purposes for Medical, PA and NP students only and not part of the legal medical record.   CSN: [id number] Arrival date & time: 08/29/24  2329      History   Chief Complaint Chief Complaint  Patient presents with   Hematochezia    HPI Carol [name] is a 33 y.o. female.  33 year old female with a PMHx of GERD, pseudotumor cerebri and necrotizing fascitis presents to the emergency department with a chief complaint of hematochezia. She describes three episodes of hematochezia over the past three weeks which were each precipitated by an episode of acute, sharp, twisting epigastric pain, stating that her most recent episode was last night and that this episode had the largest amount of blood in the stool and was precipitated by more severe abdominal pain than before. She additionally endorses nausea, lightheadedness, dizziness, and gait instability. Denies chest pain, SOB, headache, vision changes, urinary symptoms, weakness, numbness, or any other complaints. She does note that she is currently on her menses and says that her flow has been more heavy than usual. Patient underwent wisdom teeth removal one month ago and she says that she stopped taking her Omeprazole 40 mg for three weeks after that procedure but resumed this medication a few weeks ago.   The history is provided by the patient.  Rectal Bleeding Quality:  Bright red Duration:  3 weeks Chronicity:  New Context: defecation   Similar prior episodes: yes   Ineffective treatments:  None tried Associated symptoms: abdominal pain, dizziness and recent illness   Associated symptoms: no fever, no hematemesis and no vomiting   Abdominal pain:    Location:  Epigastric   Quality: sharp and squeezing     Timing:  Intermittent   Progression:  Worsening   Past Medical History:  Diagnosis Date   Depression 10/11/2013   Headache(784.0) 06/25/2013   Late  prenatal care 03/06/2013   Maternal group B streptococcal infection 05/27/2013   Treat in labor  >>  Allergic PCN, Resistant to [place]  >> needs Vanc    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)    got from the chicken pox as a child    Panic attacks    Pseudotumor cerebri     Patient Active Problem List   Diagnosis Date Noted   ERRONEOUS ENCOUNTER--DISREGARD 04/30/2024   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 Authorizing Provider  acetaminophen  (TYLENOL ) 325 MG tablet Take 2 tablets (650 mg total) by mouth every 6 (six) hours as needed for mild pain or fever. 04/20/22   Gray, Alicia  P, DO  clindamycin  (CLEOCIN ) 300 MG capsule Take 300 mg by mouth 2 (two) times daily. Patient not taking: Reported on 08/06/2024 07/15/24   [provider]  cyclobenzaprine (FLEXERIL) 5 MG tablet Take 5 mg by mouth 3 (three) times daily as needed. Patient not taking: Reported on 08/06/2024 07/19/24   [provider]  Elastic Bandages & Supports (ABDOMINAL BINDER/ELASTIC LARGE) MISC 1 Units by Does not apply route daily. 08/06/24   Almetta Donnice LABOR, MD  fluticasone (FLONASE) 50 MCG/ACT nasal spray Place 1 spray into both nostrils daily. 11/14/22   [provider]  methylPREDNISolone (MEDROL DOSEPAK) 4 MG TBPK tablet SMARTSIG:- Tablet(s) By Mouth - Patient not taking: Reported on 08/06/2024  07/19/24   [provider]  omeprazole (PRILOSEC) 40 MG capsule Take 40 mg by mouth daily.    [provider]  pantoprazole (PROTONIX) 40 MG tablet Take 40 mg by mouth. 08/05/24 08/05/25  [provider]  sodium chloride  (OCEAN) 0.65 % nasal spray Place 1 spray into the nose. 08/05/24 09/04/24  [provider]    Family History Family History  Problem Relation Age of Onset   Diabetes Paternal Grandfather    Cancer Paternal Grandmother    Diabetes Maternal Grandmother    Heart disease Maternal Grandmother    Osteoporosis Maternal Grandmother    Atrial fibrillation Maternal Grandfather    Asthma Father    Diabetes Mother    Asthma Sister    Mental illness Sister     Social History Social History   Tobacco Use   Smoking status: Never   Smokeless tobacco: Never  Vaping Use   Vaping status: Never Used  Substance Use Topics   Alcohol use: No   Drug use: No     Allergies   Penicillins, Soy allergy (obsolete), and Minocycline   Review of Systems Review of Systems  Constitutional:  Negative for chills and fever.  HENT:  Negative for sore throat.   Respiratory:  Negative for cough and shortness of breath.   Cardiovascular:  Negative for chest pain and palpitations.  Gastrointestinal:  Positive for abdominal pain, blood in stool, hematochezia and nausea. Negative for constipation, diarrhea, hematemesis and vomiting.  Genitourinary:  Positive for vaginal bleeding (menses). Negative for decreased urine volume and dysuria.  Skin:  Negative for rash.  Neurological:  Positive for dizziness.  Psychiatric/Behavioral:  The patient is not nervous/anxious.      Physical Exam Updated Vital Signs BP 114/76 (BP Location: Left Arm)   Pulse 80   Temp 98.2 F (36.8 C)   Resp 20   SpO2 100%   Physical Exam Constitutional:      General: She is not in acute distress.    Appearance: She is not ill-appearing.  Cardiovascular:     Rate and Rhythm:  Normal rate and regular rhythm.  Abdominal:     General: Bowel sounds are normal. There is no distension.     Palpations: Abdomen is soft.     Tenderness: There is abdominal tenderness (epigastric). There is no guarding.  Musculoskeletal:     Cervical back: Neck supple.  Skin:    General: Skin is warm.     Capillary Refill: Capillary refill takes less than 2 seconds.     Coloration: Skin is pale.  Neurological:     General: No focal deficit present.     Mental Status: She is alert and oriented to person, place, and time. Mental status is at baseline.  Psychiatric:  Mood and Affect: Mood normal.        Behavior: Behavior normal.      ED Treatments / Results  Labs (all labs ordered are listed, but only abnormal results are displayed) Labs Reviewed  CBC WITH DIFFERENTIAL/PLATELET - Abnormal; Notable for the following components:      Result Value   Hemoglobin 8.3 (*)    HCT 29.8 (*)    MCV 63.3 (*)    MCH 17.6 (*)    MCHC 27.9 (*)    RDW 19.9 (*)    All other components within normal limits  COMPREHENSIVE METABOLIC PANEL WITH GFR - Abnormal; Notable for the following components:   Potassium 3.4 (*)    Alkaline Phosphatase 37 (*)    All other components within normal limits  URINALYSIS, ROUTINE W REFLEX MICROSCOPIC - Abnormal; Notable for the following components:   Color, Urine AMBER (*)    APPearance CLOUDY (*)    Hgb urine dipstick LARGE (*)    Ketones, ur 20 (*)    Protein, ur 100 (*)    Bacteria, UA RARE (*)    All other components within normal limits  HCG, SERUM, QUALITATIVE  SAMPLE TO BLOOD BANK    EKG  Radiology No results found.  Procedures Procedures (including critical care time)  Medications Ordered in ED Medications - No data to display   Initial Impression / Assessment and Plan / ED Course  I have reviewed the triage vital signs and the nursing notes.  Pertinent labs & imaging results that were available during my care of the patient  were reviewed by me and considered in my medical decision making (see chart for details).  33 year old female with a PMHx of GERD and pseudotumor cerebri on chronic PPI presented to the emergency department with a CC of hematochezia. Initial workup showed microcytic anemia (8.3) and slight hypokalemia (3.4) but was otherwise unremarkable. Chronic Hg in the 12-14 range 2 years ago, but on chart review the patient was seen one month ago where Hg was 8.2. Pregnancy test negative. Rectal exam did not show gross blood but was hemoccult positive. Given patient's stable, slightly improving anemia hemodynamic instability, patient was discharged home with advice to follow up with GI outpatient for further workup. Patient agreeable to plan of care.   Final Clinical Impressions(s) / ED Diagnoses   Final diagnoses:  None    New Prescriptions New Prescriptions   No medications on file

## 2024-08-30 NOTE — ED Provider Notes (Signed)
 [place] EMERGENCY DEPARTMENT AT [facility] Provider Note   CSN: [id number] Arrival date & time: 08/29/24  2329     Patient presents with: Hematochezia   Carol [name] is a 33 y.o. female.   HPI  Patient is a 33 year old female with a past medical significant for pseudotumor cerebri, panic attacks, depression, headaches  Patient presents emergency room today with complaints of some crampy abdominal pain that happens intermittently over the past 3 weeks she has had 3 episodes.  She states that the pain is sharp and sudden and lasts for just a moment and then completely resolves and then she will have some blood in her poop within the next hour or 2.  She states that she has only had bright red blood when she wipes.  She has not had any fever, nausea, vomiting, chest pain or shortness of breath.  She states she does feel like she is a little weaker and more tired than usual.  She was found to be anemic with a hemoglobin of 8.2 over a month ago had an anemia panel which showed severe iron deficiency she has not been started on oral iron yet.  She states she has had somewhat heavier vaginal bleeding over the past menstrual cycle.  No nosebleeds or oral bleeding.     Prior to Admission medications   Medication Sig Start Date End Date Taking? Authorizing Provider  ferrous gluconate  (FERGON) 324 MG tablet Take 1 tablet (324 mg total) by mouth daily with breakfast. 08/30/24  Yes Khamryn Calderone S, PA  potassium chloride  (KLOR-CON  M) 10 MEQ tablet Take 1 tablet (10 mEq total) by mouth daily. 08/30/24  Yes Deangela Randleman S, PA  acetaminophen  (TYLENOL ) 325 MG tablet Take 2 tablets (650 mg total) by mouth every 6 (six) hours as needed for mild pain or fever. 04/20/22   Elnor Hila P, DO  clindamycin  (CLEOCIN ) 300 MG capsule Take 300 mg by mouth 2 (two) times daily. Patient not taking: Reported on 08/06/2024 07/15/24   [provider]  cyclobenzaprine (FLEXERIL) 5  MG tablet Take 5 mg by mouth 3 (three) times daily as needed. Patient not taking: Reported on 08/06/2024 07/19/24   [provider]  Elastic Bandages & Supports (ABDOMINAL BINDER/ELASTIC LARGE) MISC 1 Units by Does not apply route daily. 08/06/24   Almetta Donnice LABOR, MD  fluticasone (FLONASE) 50 MCG/ACT nasal spray Place 1 spray into both nostrils daily. 11/14/22   [provider]  methylPREDNISolone (MEDROL DOSEPAK) 4 MG TBPK tablet SMARTSIG:- Tablet(s) By Mouth - Patient not taking: Reported on 08/06/2024 07/19/24   [provider]  omeprazole (PRILOSEC) 40 MG capsule Take 40 mg by mouth daily.    [provider]  pantoprazole (PROTONIX) 40 MG tablet Take 40 mg by mouth. 08/05/24 08/05/25  [provider]  sodium chloride  (OCEAN) 0.65 % nasal spray Place 1 spray into the nose. 08/05/24 09/04/24  [provider]    Allergies: Penicillins, Soy allergy (obsolete), and Minocycline    Review of Systems  Updated Vital Signs BP 114/76 (BP Location: Left Arm)   Pulse 80   Temp 98.2 F (36.8 C)   Resp 20   SpO2 100%   Physical Exam Vitals and nursing note reviewed.  Constitutional:      General: She is not in acute distress. HENT:     Head: Normocephalic and atraumatic.     Nose: Nose normal.     Mouth/Throat:     Mouth: Mucous  membranes are moist.  Eyes:     General: No scleral icterus. Cardiovascular:     Rate and Rhythm: Normal rate and regular rhythm.     Pulses: Normal pulses.     Heart sounds: Normal heart sounds.  Pulmonary:     Effort: Pulmonary effort is normal. No respiratory distress.     Breath sounds: No wheezing.  Abdominal:     Palpations: Abdomen is soft.     Tenderness: There is no abdominal tenderness. There is no guarding or rebound.  Genitourinary:    Comments: Chelsea chaperone present for exam  External hemorrhoids present.  No obvious fissures Rectal vault is empty, no gross blood. Musculoskeletal:      Cervical back: Normal range of motion.     Right lower leg: No edema.     Left lower leg: No edema.  Skin:    General: Skin is warm and dry.     Capillary Refill: Capillary refill takes less than 2 seconds.  Neurological:     Mental Status: She is alert. Mental status is at baseline.  Psychiatric:        Mood and Affect: Mood normal.        Behavior: Behavior normal.     (all labs ordered are listed, but only abnormal results are displayed) Labs Reviewed  CBC WITH DIFFERENTIAL/PLATELET - Abnormal; Notable for the following components:      Result Value   Hemoglobin 8.3 (*)    HCT 29.8 (*)    MCV 63.3 (*)    MCH 17.6 (*)    MCHC 27.9 (*)    RDW 19.9 (*)    All other components within normal limits  COMPREHENSIVE METABOLIC PANEL WITH GFR - Abnormal; Notable for the following components:   Potassium 3.4 (*)    Alkaline Phosphatase 37 (*)    All other components within normal limits  URINALYSIS, ROUTINE W REFLEX MICROSCOPIC - Abnormal; Notable for the following components:   Color, Urine AMBER (*)    APPearance CLOUDY (*)    Hgb urine dipstick LARGE (*)    Ketones, ur 20 (*)    Protein, ur 100 (*)    Bacteria, UA RARE (*)    All other components within normal limits  POC OCCULT BLOOD, ED - Abnormal; Notable for the following components:   Fecal Occult Bld POSITIVE (*)    All other components within normal limits  HCG, SERUM, QUALITATIVE  SAMPLE TO BLOOD BANK    EKG: None  Radiology: No results found.   Procedures   Medications Ordered in the ED - No data to display  Clinical Course as of 08/30/24 0821  Fri Aug 30, 2024  0805 Eosinophil: 2 [MP]    Clinical Course User Index [MP] Pamella Ozell LABOR, DO                                 Medical Decision Making Amount and/or Complexity of Data Reviewed Labs: ordered.   This patient presents to the ED for concern of rectal bleeding, this involves a number of treatment options, and is a complaint that  carries with it a moderate risk of complications and morbidity. A differential diagnosis was considered for the patient's symptoms which is discussed below:   The differential diagnosis for lower GI bleed includes but is not limited to high flow upper GI bleed, diverticulosis, vascular ectasia/AVM, inflammatory bowel disease, infectious colitis, mesenteric ischemia or  ischemic colitis,  colorectal cancer or polyps, internal hemorrhoids, aortoenteric fistula, rectal foreign body, rectal ulceration or anal fissure.    Co morbidities: Discussed in HPI   Brief History:  Patient is a 33 year old female with a past medical significant for pseudotumor cerebri, panic attacks, depression, headaches  Patient presents emergency room today with complaints of some crampy abdominal pain that happens intermittently over the past 3 weeks she has had 3 episodes.  She states that the pain is sharp and sudden and lasts for just a moment and then completely resolves and then she will have some blood in her poop within the next hour or 2.  She states that she has only had bright red blood when she wipes.  She has not had any fever, nausea, vomiting, chest pain or shortness of breath.  She states she does feel like she is a little weaker and more tired than usual.  She was found to be anemic with a hemoglobin of 8.2 over a month ago had an anemia panel which showed severe iron deficiency she has not been started on oral iron yet.  She states she has had somewhat heavier vaginal bleeding over the past menstrual cycle.  No nosebleeds or oral bleeding.    EMR reviewed including pt PMHx, past surgical history and past visits to ER.   See HPI for more details   Lab Tests:   I ordered and independently interpreted labs. Labs notable for Hemoglobin of 8.3, slight increase from 8.66-month previous Microcytic, hypochromic consistent of iron deficiency anemia Mild hypokalemia  Imaging Studies:  No imaging studies  ordered for this patient    Cardiac Monitoring:  NA NA   Medicines ordered:    Critical Interventions:     Consults/Attending Physician   I discussed this case with my attending physician who cosigned this note including patient's presenting symptoms, physical exam, and planned diagnostics and interventions. Attending physician stated agreement with plan or made changes to plan which were implemented.   Reevaluation:  After the interventions noted above I re-evaluated patient and found that they have :stayed the same   Social Determinants of Health:      Problem List / ED Course:  Patient with bright red blood per rectum 3 episodes in 3 weeks has seemingly chronic anemia with 1 month prior iron deficiency anemia shown on lab work.  No gross blood on rectal exam does have microscopic blood we will refer to GI.  Is having some heavy menstrual bleeding will need to follow-up with OB/GYN.  I have given her information for both of these offices.  I have started her on oral iron, and her potassium repletion and recommended that she follow-up with primary care.  Very strict return precautions emergency room provided and able to be taught back by patient.  She specifically does not need would not be a sign however she should monitor her symptoms very closely for any worsening of her fatigue.   Dispostion:  After consideration of the diagnostic results and the patients response to treatment, I feel that the patent would benefit from outpatient follow-up.    Final diagnoses:  Rectal bleeding  Hypokalemia    ED Discharge Orders          Ordered    ferrous gluconate  (FERGON) 324 MG tablet  Daily with breakfast        08/30/24 0818    potassium chloride  (KLOR-CON  M) 10 MEQ tablet  Daily        08/30/24  9180               Neldon Hamp RAMAN, PA 08/30/24 0823    Pamella Ozell LABOR, DO 09/04/24 [phone]

## 2024-08-30 NOTE — ED Notes (Signed)
 Pt ambulated to the bathroom.

## 2024-09-03 ENCOUNTER — Encounter: Payer: Self-pay | Admitting: Gastroenterology

## 2024-09-09 NOTE — Progress Notes (Unsigned)
 GI Office Note    Referring Provider: Almarie Oneil BROCKS., MD Primary Care Physician:  Almarie Oneil BROCKS., MD  Primary Gastroenterologist: Carlin POUR. Cindie, DO  Chief Complaint   Chief Complaint  Patient presents with   Abdominal Pain    Stomach pains for a while. Thinks she may be losing blooding due do having some labs done and they were abnormal    History of Present Illness   Carol [name] is a 33 y.o. female presenting today at the request of Almarie Oneil BROCKS., MD for iron deficiency anemia and rectal bleeding.   ED visit at [facility] 08/29/24 for reports of rectal bleeding and cramping abdominal pain. Had 3 episodes over the last 3 weeks. Pain sharp and sudden and short in duration but with blood in stool for the next 1-2 hours. Only brbpr when wiping. Feeling weak and tired. Reportedly  1 month prior hgb 8.2 and severe iron deficiency but not started on oral iron. Did report menorrhagia as well. EDP performed rectal and noted empty rectal vault, no obvious fissures and external hemorrhoids present.  Hgb 8.3, stool heme positive. They advised to start oral iron daily and follow up with pcp and referred to GI.   Today:  Discussed the use of AI scribe software for clinical note transcription with the patient, who gave verbal consent to proceed.  She has been experiencing bright red rectal bleeding since the day after Thanksgiving, with blood visible in the toilet and on toilet paper. Her bowel habits fluctuate between constipation and diarrhea, with diarrhea often occurring during her menstrual cycle. She has had episodes of black stools in the past, unrelated to iron supplementation.  The abdominal pain is described as a twisting sensation with a burning feeling in the stomach. These symptoms have persisted despite a recent hospital visit. Over the past five months, she has noticed a change in her menstrual bleeding pattern, with lighter, watery, bright red bleeding.  Her diet is similar  to keto, consisting of meat, eggs, non-starchy vegetables, peanut butter, konjac noodles, and cheese. She avoids starchy foods to manage pseudotumor cerebri and reports significant discomfort and headaches when deviating from this diet. She has a significant fear of eating due to the pain and symptoms it causes.  She takes omeprazole for acid reflux but has not switched to pantoprazole due to anxiety about medication changes, stemming from past adverse reactions, including pseudotumor cerebri. She has a history of anemia, with recent labs showing a ferritin level of 2, hemoglobin of 8.2, and iron level of 16. Her prior hemoglobin values were 10.2 in August of last year and 11.8 in 2023.  Her family history includes ulcers in her mother, and she is concerned about the possibility of colon cancer, although there is no significant family history of cancer or polyps. She has a known external hemorrhoid since age 89, which has enlarged but is not currently symptomatic. She describes a new foul smell associated with her bowel movements and is concerned about potential infections, given her family's history of C. diff infections.      Wt Readings from Last 6 Encounters:  09/10/24 161 lb 6.4 oz (73.2 kg)  08/06/24 164 lb 6.4 oz (74.6 kg)  07/24/23 169 lb 9.6 oz (76.9 kg)  05/22/22 152 lb (68.9 kg)  04/20/22 145 lb (65.8 kg)  12/08/21 145 lb (65.8 kg)    Body mass index is 32.6 kg/m.  Current Outpatient Medications  Medication Sig Dispense Refill  acetaminophen  (TYLENOL ) 325 MG tablet Take 2 tablets (650 mg total) by mouth every 6 (six) hours as needed for mild pain or fever. 30 tablet 0   ferrous gluconate  (FERGON) 324 MG tablet Take 1 tablet (324 mg total) by mouth daily with breakfast. (Patient taking differently: Take 324 mg by mouth daily with breakfast. Takes every other day, because it hurts her stomach) 30 tablet 1   fluticasone (FLONASE) 50 MCG/ACT nasal spray Place 1 spray into both nostrils  daily.     omeprazole (PRILOSEC) 40 MG capsule Take 40 mg by mouth daily.     sodium chloride  (OCEAN) 0.65 % nasal spray Place 1 spray into the nose.     clindamycin  (CLEOCIN ) 300 MG capsule Take 300 mg by mouth 2 (two) times daily. (Patient not taking: Reported on 09/10/2024)     cyclobenzaprine (FLEXERIL) 5 MG tablet Take 5 mg by mouth 3 (three) times daily as needed. (Patient not taking: Reported on 09/10/2024)     Elastic Bandages & Supports (ABDOMINAL BINDER/ELASTIC LARGE) MISC 1 Units by Does not apply route daily. 1 each 0   methylPREDNISolone (MEDROL DOSEPAK) 4 MG TBPK tablet SMARTSIG:- Tablet(s) By Mouth - (Patient not taking: Reported on 09/10/2024)     pantoprazole (PROTONIX) 40 MG tablet Take 40 mg by mouth. (Patient not taking: Reported on 09/10/2024)     potassium chloride  (KLOR-CON  M) 10 MEQ tablet Take 1 tablet (10 mEq total) by mouth daily. (Patient not taking: Reported on 09/10/2024) 5 tablet 0   No current facility-administered medications for this visit.    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)    got from the chicken pox as a child    Panic attacks    Pseudotumor cerebri     Past Surgical History:  Procedure Laterality Date   addenoidectomy     LUMBAR PUNCTURE     PYLOROMYOTOMY     TONSILLECTOMY      Family History  Problem Relation Age of Onset   Diabetes Paternal Grandfather    Cancer Paternal Grandmother    Diabetes Maternal Grandmother    Heart disease Maternal Grandmother    Osteoporosis Maternal Grandmother    Atrial fibrillation Maternal Grandfather    Asthma Father    Diabetes Mother    Asthma Sister    Mental illness Sister     Allergies as of 09/10/2024 - Review Complete  09/10/2024  Allergen Reaction Noted   Penicillins Anaphylaxis 03/06/2013   Soy allergy (obsolete) Dermatitis and Rash 12/29/2023   Minocycline Other (See Comments) 04/23/2018    Social History   Socioeconomic History   Marital status: Married    Spouse name: Not on file   Number of children: Not on file   Years of education: Not on file   Highest education level: Not on file  Occupational History   Not on file  Tobacco Use   Smoking status: Never   Smokeless tobacco: Never  Vaping Use   Vaping status: Never Used  Substance and Sexual Activity   Alcohol use: No   Drug use: No   Sexual activity: Yes    Birth control/protection: None  Other Topics Concern   Not on file  Social History  Narrative   Not on file   Social Drivers of Health   Tobacco Use: Low Risk (09/10/2024)   Patient History    Smoking Tobacco Use: Never    Smokeless Tobacco Use: Never    Passive Exposure: Not on file  Financial Resource Strain: Not on file  Food Insecurity: Low Risk (08/05/2024)   Received from [facility]   Epic    Within the past 12 months, you worried that your food would run out before you got money to buy more: Never true    Within the past 12 months, the food you bought just didn't last and you didn't have money to get more. : Never true  Transportation Needs: No Transportation Needs (08/05/2024)   Received from [facility]    In the past 12 months, has lack of reliable transportation kept you from medical appointments, meetings, work or from getting things needed for daily living? : No  Physical Activity: Not on file  Stress: Not on file  Social Connections: Unknown (01/27/2022)   Received from [facility]   Social Network    Social Network: Not on file  Intimate Partner Violence: Unknown (12/30/2021)   Received from [facility]   HITS    Physically Hurt: Not on file    Insult or Talk Down To: Not on file    Threaten Physical Harm: Not on file     Scream or Curse: Not on file  Depression (EYV7-0): Not on file  Alcohol Screen: Not on file  Housing: Low Risk (08/05/2024)   Received from [facility]   Epic    What is your living situation today?: I have a steady place to live    Think about the place you live. Do you have problems with any of the following? Choose all that apply:: None/None on this list  Utilities: Low Risk (08/05/2024)   Received from [facility]   Utilities    In the past 12 months has the electric, gas, oil, or water company threatened to shut off services in your home? : No  Health Literacy: Not on file    Review of Systems   Gen: + fatigue Denies any fever, chills, weight loss, lack of appetite.  CV: Denies chest pain, heart palpitations, peripheral edema, syncope.  Resp: Denies shortness of breath at rest or with exertion. Denies wheezing or cough.  GI: see HPI GU : Denies urinary burning, urinary frequency, urinary hesitancy MS: + joint pain. Denies muscle weakness, cramps, or limitation of movement.  Psych: + anxiety. Denies depression, memory loss, and confusion Heme: Denies bruising, bleeding, and enlarged lymph nodes.  Physical Exam   BP 132/78 (BP Location: Left Wrist, Patient Position: Sitting, Cuff Size: Normal)   Pulse 79   Temp 98.4 F (36.9 C) (Temporal)   Ht 4' 11 (1.499 m)   Wt 161 lb 6.4 oz (73.2 kg)   LMP 08/07/2024 (Approximate)   BMI 32.60 kg/m   General:   Alert and oriented. Pleasant and cooperative. Well-nourished and well-developed.  Head:  Normocephalic and atraumatic. Eyes:  Without icterus, sclera clear and conjunctiva pink.  Ears:  Normal auditory acuity. Mouth:  No deformity or lesions, oral mucosa pink.  Lungs:  Clear to auscultation bilaterally. No wheezes, rales, or rhonchi. No distress.  Heart:  S1, S2 present without murmurs appreciated.  Abdomen:  +BS, soft, non-distended. Ttp to epigastrium, LUQ and mildly to RUQ. No HSM noted. No guarding or rebound. No  masses appreciated.  Rectal:  deferred Msk:  Symmetrical without gross deformities. Normal posture. Extremities:  Without edema. Neurologic:  Alert and  oriented x4;  grossly normal neurologically. Skin:  Intact without significant lesions or rashes. Psych:  Alert and cooperative. Normal mood and affect.  Assessment & Plan   Danissa Julliana Whitmyer is a 33 y.o. female with a history of pseudotumor cerebri, panic attacks/anxiety, depression, headaches presenting today with complaints of anemia, fatigue, rectal bleeding, and abdominal pain.     Iron deficiency anemia due to likely chronic gastrointestinal blood loss Chronic iron deficiency anemia with hemoglobin at 8.2, ferritin at 2, and iron saturation at 3%. Likely due to chronic gastrointestinal blood loss. Differential includes hemorrhoids, vascular lesions, polyps, inflammatory bowel disease (ulcerative colitis, Crohn's disease), and H. pylori infection. Less likely malignancy but can not completely exclude. Hemoglobin has been trending down over the past few years. No significant weight loss or severe symptoms suggestive of malignancy. Recently started on iron supplementation. Has significant fatigue.  - Ordered upper endoscopy and colonoscopy to evaluate for source of bleeding - Will consider Givens capsule if endoscopy and colonoscopy are inconclusive. - Will repeat iron level and hemoglobin before procedure potentially.  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 Symptoms include cramping, burning sensation, and fluctuating bowel movements. Diet is low in fiber, which may contribute to symptoms. Anxiety about medication use noted. Given bloating other differentials include IBS, SIBO, IMO. - Increase omeprazole to 40 mg twice daily to manage burning sensation. - Increase dietary fiber intake to 25-35 grams per day. - Consider prescription options for IBS with  constipation if fiber supplementation is insufficient.  Epigastric pain Chronic epigastric pain with burning sensation, possibly related to GERD, gastritis, H. pylori, esophagitis, duodenitis peptic ulcer disease. Pain exacerbated by eating. Anxiety about eating due to fear of exacerbating symptoms. - Increased omeprazole to 40 mg twice daily to reduce gastric acid and allow healing of potential gastritis or ulcer. - Monitor for improvement in symptoms with increased omeprazole dosage.  Rectal bleeding Intermittent rectal bleeding since after Thanksgiving, likely related to hemorrhoids or other vascular lesions. Differential includes internal hemorrhoids, polyps, or inflammatory bowel disease. Bleeding is bright red and associated with pain. - Use over-the-counter hemorrhoid cream or suppositories to assess for internal hemorrhoids. - Will evaluate for potential internal hemorrhoids during colonoscopy.  External hemorrhoids Long-standing external hemorrhoids, not currently causing significant issues. Possible internal hemorrhoids contributing to rectal bleeding. - Use over-the-counter hemorrhoid cream or suppositories for treatment of hemorrhoids - Will evaluate for potential internal hemorrhoids during colonoscopy.     Procedure:  Proceed with upper endoscopy and colonoscopy with propofol by Dr. Cindie in near future: the risks, benefits, and alternatives have been discussed with the patient in detail. The patient states understanding and desires to proceed. ASA 3 - any room  UPT  Hold iron 1 week prior.    Follow up   Follow up 3 months, sooner if needed pending procedural findings.   Carol Melia, MSN, FNP-BC, AGACNP-BC [facility]

## 2024-09-10 ENCOUNTER — Encounter: Payer: Self-pay | Admitting: Gastroenterology

## 2024-09-10 ENCOUNTER — Ambulatory Visit: Admitting: Gastroenterology

## 2024-09-10 VITALS — BP 132/78 | HR 79 | Temp 98.4°F | Ht 59.0 in | Wt 161.4 lb

## 2024-09-10 DIAGNOSIS — D509 Iron deficiency anemia, unspecified: Secondary | ICD-10-CM

## 2024-09-10 DIAGNOSIS — R1013 Epigastric pain: Secondary | ICD-10-CM

## 2024-09-10 DIAGNOSIS — K625 Hemorrhage of anus and rectum: Secondary | ICD-10-CM | POA: Diagnosis not present

## 2024-09-10 DIAGNOSIS — K5909 Other constipation: Secondary | ICD-10-CM | POA: Diagnosis not present

## 2024-09-10 DIAGNOSIS — K644 Residual hemorrhoidal skin tags: Secondary | ICD-10-CM

## 2024-09-10 DIAGNOSIS — K581 Irritable bowel syndrome with constipation: Secondary | ICD-10-CM

## 2024-09-10 DIAGNOSIS — K649 Unspecified hemorrhoids: Secondary | ICD-10-CM

## 2024-09-10 NOTE — Patient Instructions (Addendum)
 We will get you scheduled for EGD and colonoscopy in the near future with Dr. Cindie to further evaluate your upper and lower GI symptoms along with your anemia.  Please continue taking your iron daily with food.  If you are able to tolerate vitamin C I recommend you taking this as well as it helps with absorption.  As we discussed I want you to increase your omeprazole to 40 mg twice daily to help with acid suppression to see if this helps with the burning in your stomach and the tenderness that you have in your upper abdomen.  I suspect there may be some acid reflux/gastritis which is causing the symptoms.  I want you to start taking a daily fiber supplement as well, generic psyllium or Metamucil is what is recommended.  You should take 1 tablespoon in 8 ounces of water or 3-5 fiber capsules daily.  We can certainly do some additional testing to assess for SIBO or intestinal methanogenic overgrowth with a special breath test that is mailed to your house in the future if you are still having foul-smelling stools/flatulence after we have evaluated the GI tract with the upper endoscopy and colonoscopy.  Follow up 3 months, potentially sooner pending  procedure results.  It was a pleasure to see you today. I want to create trusting relationships with patients. If you receive a survey regarding your visit,  I greatly appreciate you taking time to fill this out on paper or through your MyChart. I value your feedback.  Charmaine Melia, MSN, FNP-BC, AGACNP-BC [facility]

## 2024-09-12 ENCOUNTER — Telehealth: Payer: Self-pay | Admitting: *Deleted

## 2024-09-12 NOTE — Telephone Encounter (Signed)
 LMOVM to call back to schedule TCS/EGD with Dr. Cindie, ASA 3 (ANY ROOM), NEEDS UPT AND HOLD IRON X 1 WEEK PRIOR

## 2024-09-13 NOTE — Telephone Encounter (Signed)
 LMOVM to call back

## 2024-09-23 NOTE — Telephone Encounter (Signed)
 LMOVM to call back to schedule. Letter also mailed.

## 2024-09-24 ENCOUNTER — Encounter: Payer: Self-pay | Admitting: *Deleted

## 2024-09-24 ENCOUNTER — Other Ambulatory Visit: Payer: Self-pay | Admitting: *Deleted

## 2024-09-24 DIAGNOSIS — D509 Iron deficiency anemia, unspecified: Secondary | ICD-10-CM

## 2024-09-24 DIAGNOSIS — K625 Hemorrhage of anus and rectum: Secondary | ICD-10-CM

## 2024-09-24 DIAGNOSIS — R1013 Epigastric pain: Secondary | ICD-10-CM

## 2024-09-24 MED ORDER — PEG 3350-KCL-NA BICARB-NACL 420 G PO SOLR: 4000.0000 mL | Freq: Once | ORAL | 0 refills | Status: AC

## 2024-09-24 NOTE — Telephone Encounter (Signed)
 Pt has been scheduled for 10/21/24. Instructions mailed and prep sent to pharmacy.

## 2024-10-11 ENCOUNTER — Ambulatory Visit: Admitting: Gastroenterology

## 2024-10-11 NOTE — Progress Notes (Deleted)
 SABRA

## 2024-10-13 ENCOUNTER — Emergency Department (HOSPITAL_COMMUNITY)

## 2024-10-13 ENCOUNTER — Encounter (HOSPITAL_COMMUNITY): Payer: Self-pay

## 2024-10-13 ENCOUNTER — Other Ambulatory Visit: Payer: Self-pay

## 2024-10-13 ENCOUNTER — Emergency Department (HOSPITAL_COMMUNITY)
Admission: EM | Admit: 2024-10-13 | Discharge: 2024-10-13 | Disposition: A | Discharge status: Home / Self Care | Attending: Emergency Medicine | Admitting: Emergency Medicine

## 2024-10-13 DIAGNOSIS — R1084 Generalized abdominal pain: Secondary | ICD-10-CM | POA: Diagnosis present

## 2024-10-13 DIAGNOSIS — K921 Melena: Secondary | ICD-10-CM | POA: Diagnosis not present

## 2024-10-13 DIAGNOSIS — D649 Anemia, unspecified: Secondary | ICD-10-CM | POA: Diagnosis not present

## 2024-10-13 DIAGNOSIS — R112 Nausea with vomiting, unspecified: Secondary | ICD-10-CM | POA: Diagnosis not present

## 2024-10-13 LAB — COMPREHENSIVE METABOLIC PANEL WITH GFR
ALT: 22 U/L (ref 0–44)
AST: 23 U/L (ref 15–41)
Albumin: 4.6 g/dL (ref 3.5–5.0)
Alkaline Phosphatase: 47 U/L (ref 38–126)
Anion gap: 13 (ref 5–15)
BUN: 20 mg/dL (ref 6–20)
CO2: 23 mmol/L (ref 22–32)
Calcium: 9.5 mg/dL (ref 8.9–10.3)
Chloride: 102 mmol/L (ref 98–111)
Creatinine, Ser: 0.55 mg/dL (ref 0.44–1.00)
GFR, Estimated: >60 mL/min
Glucose, Bld: 102 mg/dL — ABNORMAL HIGH (ref 70–99)
Potassium: 3.8 mmol/L (ref 3.5–5.1)
Sodium: 137 mmol/L (ref 135–145)
Total Bilirubin: 0.5 mg/dL (ref 0.0–1.2)
Total Protein: 8.2 g/dL — ABNORMAL HIGH (ref 6.5–8.1)

## 2024-10-13 LAB — CBC
HCT: 31.8 % — ABNORMAL LOW (ref 36.0–46.0)
Hemoglobin: 8.8 g/dL — ABNORMAL LOW (ref 12.0–15.0)
MCH: 17.5 pg — ABNORMAL LOW (ref 26.0–34.0)
MCHC: 27.7 g/dL — ABNORMAL LOW (ref 30.0–36.0)
MCV: 63.1 fL — ABNORMAL LOW (ref 80.0–100.0)
Platelets: 263 K/uL (ref 150–400)
RBC: 5.04 MIL/uL (ref 3.87–5.11)
RDW: 19 % — ABNORMAL HIGH (ref 11.5–15.5)
WBC: 9.5 K/uL (ref 4.0–10.5)
nRBC: 0 % (ref 0.0–0.2)

## 2024-10-13 LAB — LIPASE, BLOOD: Lipase: 34 U/L (ref 11–51)

## 2024-10-13 LAB — ABO/RH: ABO/RH(D): O POS

## 2024-10-13 LAB — TYPE AND SCREEN
ABO/RH(D): O POS
Antibody Screen: NEGATIVE

## 2024-10-13 LAB — POC OCCULT BLOOD, ED: Fecal Occult Bld: POSITIVE — AB

## 2024-10-13 LAB — HCG, SERUM, QUALITATIVE: Preg, Serum: NEGATIVE

## 2024-10-13 MED ORDER — ONDANSETRON 4 MG PO TBDP
4.0000 mg | ORAL_TABLET | Freq: Once | ORAL | Status: AC | PRN
  Administered 2024-10-13: 4 mg via ORAL

## 2024-10-13 MED ORDER — LORAZEPAM 2 MG/ML IJ SOLN: 0.5000 mg | Freq: Once | INTRAMUSCULAR | Status: DC

## 2024-10-13 MED ORDER — DIPHENHYDRAMINE HCL 50 MG/ML IJ SOLN
50.0000 mg | Freq: Once | INTRAMUSCULAR | Status: AC
  Administered 2024-10-13: 50 mg via INTRAVENOUS
  Filled 2024-10-13: qty 1

## 2024-10-13 MED ORDER — METHYLPREDNISOLONE SODIUM SUCC 125 MG IJ SOLR
125.0000 mg | Freq: Once | INTRAMUSCULAR | Status: AC
  Administered 2024-10-13: 125 mg via INTRAVENOUS
  Filled 2024-10-13: qty 2

## 2024-10-13 MED ORDER — LACTATED RINGERS IV BOLUS
1000.0000 mL | Freq: Once | INTRAVENOUS | Status: AC
  Administered 2024-10-13: 1000 mL via INTRAVENOUS

## 2024-10-13 MED ORDER — ONDANSETRON 4 MG PO TBDP
ORAL_TABLET | ORAL | Status: AC
  Filled 2024-10-13: qty 1

## 2024-10-13 MED ORDER — IOHEXOL 350 MG/ML SOLN
75.0000 mL | Freq: Once | INTRAVENOUS | Status: AC | PRN
  Administered 2024-10-13: 75 mL via INTRAVENOUS

## 2024-10-13 NOTE — ED Notes (Signed)
 Pt in Ct per previous RN

## 2024-10-13 NOTE — ED Provider Notes (Signed)
 " [facility] Provider Note   CSN: [id number] Arrival date & time: 10/13/24  9064     Patient presents with: Abdominal Pain and Nausea   Carol [name] is a 34 y.o. female.   HPI 34 year old female G2, P2 presents today complaining of abdominal pain that began yesterday.  She reports diffuse crampy abdominal pain worse in the epigastrium with radiation to the lower abdomen.  She reports episodes of rectal bleeding in the past 24 hours.  She reports that she had a similar episode in December but was not as severe.  She has had worse bleeding and pain this time.  She has had nausea and vomiting with loose bloody stools.  She has been seen by GI and is scheduled for an endoscopy in the end of January.  Some history is obtained from her mother.  They report that she has a family history of ulcerative colitis.  There is concern for Crohn's and ulcerative colitis.  She has recently been told that she had iron deficiency anemia but has not been started on any medications. Mother reports that she has been told her iron and low but was not started on any iron and hence they are changing primary care providers.  Review of records from visit to the ED in 08/29/2024 shows that she did have Fergon ordered on discharge    Prior to Admission medications  Medication Sig Start Date End Date Taking? Authorizing Provider  acetaminophen  (TYLENOL ) 325 MG tablet Take 2 tablets (650 mg total) by mouth every 6 (six) hours as needed for mild pain or fever. 04/20/22   Elnor Bernarda SQUIBB, DO  Elastic Bandages & Supports (ABDOMINAL BINDER/ELASTIC LARGE) MISC 1 Units by Does not apply route daily. 08/06/24   Almetta Donnice LABOR, MD  ferrous gluconate  [facility]) 324 MG tablet Take 1 tablet (324 mg total) by mouth daily with breakfast. Patient taking differently: Take 324 mg by mouth daily with breakfast. Takes every other day, because it hurts her stomach 08/30/24   Neldon Inoue S, PA  fluticasone (FLONASE) 50 MCG/ACT nasal spray Place 1 spray into both nostrils daily. 11/14/22   [provider]  omeprazole (PRILOSEC) 40 MG capsule Take 40 mg by mouth daily.    [provider]  pantoprazole (PROTONIX) 40 MG tablet Take 40 mg by mouth. Patient not taking: Reported on 09/10/2024 08/05/24 08/05/25  [provider]  sodium chloride  (OCEAN) 0.65 % nasal spray Place 1 spray into the nose. 08/05/24 09/10/24  [provider]    Allergies: Penicillins, Soy allergy (obsolete), and Minocycline    Review of Systems  Updated Vital Signs BP 93/63   Pulse 89   Temp 98.8 F (37.1 C) (Oral)   Resp 20   SpO2 100%   Physical Exam Vitals and nursing note reviewed.  Constitutional:      General: She is in acute distress.     Appearance: She is ill-appearing.  HENT:     Head: Normocephalic and atraumatic.     Mouth/Throat:     Mouth: Mucous membranes are moist.  Eyes:     Extraocular Movements: Extraocular movements intact.     Pupils: Pupils are equal, round, and reactive to light.  Cardiovascular:     Rate and Rhythm: Normal rate and regular rhythm.     Heart sounds: Normal heart sounds.  Pulmonary:     Effort: Pulmonary effort is normal.     Breath sounds: Normal  breath sounds.  Abdominal:     General: Abdomen is flat.     Palpations: Abdomen is soft.     Tenderness: There is generalized abdominal tenderness and tenderness in the epigastric area.  Skin:    General: Skin is warm and dry.     Capillary Refill: Capillary refill takes less than 2 seconds.  Neurological:     General: No focal deficit present.     Mental Status: She is alert.  Psychiatric:        Mood and Affect: Mood normal.     (all labs ordered are listed, but only abnormal results are displayed) Labs Reviewed  COMPREHENSIVE METABOLIC PANEL WITH GFR - Abnormal; Notable for the following components:      Result Value   Glucose, Bld 102 (*)    Total  Protein 8.2 (*)    All other components within normal limits  CBC - Abnormal; Notable for the following components:   Hemoglobin 8.8 (*)    HCT 31.8 (*)    MCV 63.1 (*)    MCH 17.5 (*)    MCHC 27.7 (*)    RDW 19.0 (*)    All other components within normal limits  POC OCCULT BLOOD, ED - Abnormal; Notable for the following components:   Fecal Occult Bld POSITIVE (*)    All other components within normal limits  HCG, SERUM, QUALITATIVE  LIPASE, BLOOD  POC OCCULT BLOOD, ED  TYPE AND SCREEN  ABO/RH    EKG: None  Radiology: No results found.   Procedures   Medications Ordered in the ED  LORazepam  (ATIVAN ) injection 0.5 mg (0 mg Intravenous Hold 10/13/24 1521)  lactated ringers  bolus 1,000 mL (has no administration in time range)  ondansetron  (ZOFRAN -ODT) disintegrating tablet 4 mg (4 mg Oral Not Given 10/13/24 1522)  methylPREDNISolone  sodium succinate (SOLU-MEDROL ) 125 mg/2 mL injection 125 mg (125 mg Intravenous Given 10/13/24 1551)  diphenhydrAMINE  (BENADRYL ) injection 50 mg (50 mg Intravenous Given 10/13/24 1551)                                    Medical Decision Making Amount and/or Complexity of Data Reviewed Labs: ordered. Radiology: ordered.  Risk Prescription drug management.   34 year old female presents today complaining of abdominal pain with bloody stools.  She states that she has had some episodes of similar symptoms in the past with a significant episode last month.  However, today is worse than it has been. Patient evaluated here with physical exam with some tenderness diffusely through her upper abdomen radiating to the left. Differential diagnosis includes but is not limited to gastritis, ulcerative disease, autoimmune diseases such as ulcerative colitis and Crohn's, small bowel obstruction, cholecystitis, other infections including viral gastroenteritis. CBC is reviewed and she is anemic but it is stable from first prior. Complete metabolic panel is  reviewed and within normal limits Pregnancy test is negative Stool is not grossly bloody but is Hemoccult positive. Plan CT scan for further evaluation.  After arrival to CT scan, informed that patient has possibly had a contrast reaction 1 seen at [facility].  She reports that she may have had some rash but also felt that she was very anxious around getting the contrast.  She did not have any difficulty breathing, low blood pressure, or other evidence of anaphylaxis.  Patient is being given Solu-Medrol  and diphenhydramine  and will proceed with CT scan. Care is discussed  with Dr. Emil who assumes care at this time.     Final diagnoses:  Generalized abdominal pain    ED Discharge Orders     None          Levander Houston, MD 10/13/24 1557  "

## 2024-10-13 NOTE — ED Notes (Signed)
 Spoke with Ct scan to notify that patient has received pre-contrast medications. Per Ct tech patient needs to have the Solu-medrol  4 hours before Ct scan and benadryl  1 hour before CT scan. Informed the CT tech that this RN gave medications as ordered which were scheduled to give now. Per Ct tech the MD needs to document that it is okay for patient to go to CT with the pre-contrast regimen that was ordered that is different from the CT allergic reaction protocol. Dr. Levander and Dr. Emil made aware. Per Dr. Levander she will document it is okay for patient to go to CT scan now. Updated Ct tech that Dr. Levander will be creating a note stating it is okay for the patient to go to CT scan.

## 2024-10-13 NOTE — ED Notes (Signed)
 Patient discharged by RN. Patient in wheelchair to lobby. Patient to ride home with mom and aunt. No additional questions.

## 2024-10-13 NOTE — ED Notes (Signed)
 Patient transported to CT

## 2024-10-13 NOTE — ED Provider Notes (Signed)
 Patient reported to CT that she had an allergic reaction or some type of reaction to contrast at [facility].  She reports that she thinks that this was due to anxiety.  She reports that she may have had some rash with it but it was unclear whether it was because of her anxiety or due to contrast.  She has had contrast in the past like difficulty.  She denies that she had any shortness of breath, lightheadedness, or signs of anaphylaxis.  Will give Solu-Medrol  and Benadryl  here but feel that she is safe to proceed to CT and have IV contrast without waiting 4 hours.   Levander Houston, MD 10/13/24 [phone]

## 2024-10-13 NOTE — ED Triage Notes (Signed)
 Pt bib pov c/o lower abdominal pain with bloody stools. Pt states this has been going on for awhile but today is worse. Pt states it feel like her stomach is being twisted.  Pt endorses bouts out sweat  Pt has a schedule colonoscopy next week

## 2024-10-13 NOTE — ED Notes (Signed)
 Lab called to add-on lipase. Per lab personnel they will add-on lipase.

## 2024-10-13 NOTE — Discharge Instructions (Signed)
 Try pepcid or tagamet up to twice a day.  Try to avoid things that may make this worse, most commonly these are spicy foods tomato based products fatty foods chocolate and peppermint.  Alcohol and tobacco can also make this worse.  Return to the emergency department for sudden worsening pain fever or inability to eat or drink.  Please follow-up with your gastroenterologist.

## 2024-10-13 NOTE — ED Provider Notes (Signed)
 Received patient in turnover from Dr. Levander.  Please see their note for further details of Hx, PE.  Briefly patient is a 34 y.o. female with a Abdominal Pain and Nausea .  N/v upper abdominal pain. Plan for CT imaging.  Lipase is normal.  CT of the abdomen pelvis without obvious acute intra-abdominal pathology.  I discussed results with the patient and family.  Encouraged them to follow-up with her family doctor in the office.  They have scheduled colonoscopy in a week.      Emil Share, DO 10/13/24 [id number]

## 2024-10-13 NOTE — ED Notes (Signed)
 MD notified of difficulty establishing IV access

## 2024-10-14 ENCOUNTER — Telehealth: Payer: Self-pay | Admitting: Gastroenterology

## 2024-10-14 NOTE — Telephone Encounter (Signed)
 Patient called in on Sunday and left voicemail she was in a lot of pain and wanted to know what she should do? She is due for procedure next week. Sent voicemail to nurse.

## 2024-10-15 ENCOUNTER — Encounter: Payer: Self-pay | Admitting: *Deleted

## 2024-10-15 ENCOUNTER — Telehealth: Payer: Self-pay | Admitting: *Deleted

## 2024-10-15 MED ORDER — NA SULFATE-K SULFATE-MG SULF 17.5-3.13-1.6 GM/177ML PO SOLN: ORAL | 0 refills | Status: AC

## 2024-10-15 NOTE — Telephone Encounter (Signed)
 LMOM for pt to call office

## 2024-10-15 NOTE — Telephone Encounter (Signed)
 Pt called and stated she was in the ER over the weekend and they said for her to see if she could get procedure sooner. Advised pt that the only appt is for tomorrow 10/16/24 and provider is out Thursday and Friday . Pt said would not give her enough time to prep. She says she would just keep procedure on 10/21/24. She also requested a different prep because she felt that the jug would make her nauseous and vomit. Advised pt can send in smaller volume prep and updated instructions sent via mychart. Pt verbalized understanding.

## 2024-10-16 ENCOUNTER — Telehealth: Payer: Self-pay | Admitting: *Deleted

## 2024-10-16 ENCOUNTER — Encounter (HOSPITAL_COMMUNITY)
Admission: RE | Admit: 2024-10-16 | Discharge: 2024-10-16 | Disposition: A | Discharge status: Home / Self Care | Source: Ambulatory Visit | Attending: Internal Medicine | Admitting: Internal Medicine

## 2024-10-16 NOTE — Telephone Encounter (Signed)
 Spoke with pt. Procedure for 1/26 has been rescheduled due to pending weather. She has moved to 2/4 at 10:30am. Aware will send new instructions via mychart

## 2024-10-16 NOTE — Telephone Encounter (Signed)
 Noted.

## 2024-10-27 ENCOUNTER — Encounter (INDEPENDENT_AMBULATORY_CARE_PROVIDER_SITE_OTHER): Payer: Self-pay

## 2024-10-28 ENCOUNTER — Telehealth: Payer: Self-pay | Admitting: *Deleted

## 2024-10-28 NOTE — Telephone Encounter (Signed)
 Called patient answering machine picked up asked to call back or reply  back to Mychart message sent yesterday.

## 2024-10-29 NOTE — Telephone Encounter (Signed)
 Lmtcb for pt and sent mychart message. She did see message regarding Dr. Cindie unable to do her procedure

## 2024-10-29 NOTE — Telephone Encounter (Signed)
 LMOVM for patient.

## 2024-10-30 ENCOUNTER — Ambulatory Visit (HOSPITAL_COMMUNITY): Admission: RE | Admit: 2024-10-30 | Source: Home / Self Care | Admitting: Internal Medicine

## 2024-10-30 ENCOUNTER — Encounter: Payer: Self-pay | Admitting: *Deleted

## 2024-10-30 NOTE — Telephone Encounter (Signed)
LMOVM to call back for pt ?

## 2024-10-30 NOTE — Telephone Encounter (Signed)
 Pt has been rescheduled to 11/21/24 at 11:15 am.  Updated instructions sent via mychart.

## 2024-11-21 ENCOUNTER — Encounter (HOSPITAL_COMMUNITY): Admission: RE | Payer: Self-pay | Source: Home / Self Care

## 2024-12-06 ENCOUNTER — Ambulatory Visit: Admitting: Student in an Organized Health Care Education/Training Program
